# Patient Record
Sex: Female | Born: 1980 | Race: White | Hispanic: Yes | Marital: Married | Smoking: Never smoker
Health system: Southern US, Community
[De-identification: ages and names within clinical notes are randomized; demographics above are authoritative.]

## PROBLEM LIST (undated history)

## (undated) ENCOUNTER — Inpatient Hospital Stay: Payer: Self-pay

## (undated) DIAGNOSIS — D649 Anemia, unspecified: Secondary | ICD-10-CM

## (undated) DIAGNOSIS — G43909 Migraine, unspecified, not intractable, without status migrainosus: Secondary | ICD-10-CM

## (undated) HISTORY — PX: GALLBLADDER SURGERY: SHX652

## (undated) HISTORY — PX: CHOLECYSTECTOMY: SHX55

---

## 2005-08-15 ENCOUNTER — Emergency Department: Payer: Self-pay | Admitting: General Practice

## 2005-08-19 ENCOUNTER — Emergency Department: Payer: Self-pay | Admitting: Emergency Medicine

## 2005-08-21 ENCOUNTER — Ambulatory Visit: Payer: Self-pay | Admitting: Obstetrics and Gynecology

## 2005-10-27 ENCOUNTER — Inpatient Hospital Stay (HOSPITAL_COMMUNITY): Admission: AD | Admit: 2005-10-27 | Discharge: 2005-10-27 | Payer: Self-pay | Admitting: Obstetrics & Gynecology

## 2005-10-30 ENCOUNTER — Inpatient Hospital Stay (HOSPITAL_COMMUNITY): Admission: AD | Admit: 2005-10-30 | Discharge: 2005-10-30 | Payer: Self-pay | Admitting: Family Medicine

## 2006-06-16 ENCOUNTER — Observation Stay: Payer: Self-pay | Admitting: Obstetrics and Gynecology

## 2006-06-24 ENCOUNTER — Inpatient Hospital Stay: Payer: Self-pay | Admitting: Obstetrics and Gynecology

## 2007-09-02 ENCOUNTER — Ambulatory Visit: Payer: Self-pay | Admitting: Family Medicine

## 2007-11-12 ENCOUNTER — Inpatient Hospital Stay: Payer: Self-pay

## 2009-02-19 ENCOUNTER — Emergency Department: Payer: Self-pay | Admitting: Emergency Medicine

## 2009-03-03 ENCOUNTER — Emergency Department: Payer: Self-pay | Admitting: Emergency Medicine

## 2009-03-09 ENCOUNTER — Ambulatory Visit: Payer: Self-pay | Admitting: General Surgery

## 2009-03-16 ENCOUNTER — Ambulatory Visit: Payer: Self-pay | Admitting: General Surgery

## 2009-10-24 ENCOUNTER — Ambulatory Visit: Payer: Self-pay

## 2011-03-26 ENCOUNTER — Emergency Department: Payer: Self-pay | Admitting: Emergency Medicine

## 2011-06-29 ENCOUNTER — Emergency Department: Payer: Self-pay | Admitting: *Deleted

## 2011-07-22 ENCOUNTER — Ambulatory Visit: Payer: Self-pay | Admitting: Gynecologic Oncology

## 2011-07-22 ENCOUNTER — Ambulatory Visit: Payer: Self-pay

## 2011-08-12 ENCOUNTER — Ambulatory Visit: Payer: Self-pay | Admitting: Gynecologic Oncology

## 2011-08-25 LAB — PATHOLOGY REPORT

## 2011-09-05 ENCOUNTER — Ambulatory Visit: Payer: Self-pay | Admitting: Gynecologic Oncology

## 2011-10-02 ENCOUNTER — Ambulatory Visit: Payer: Self-pay | Admitting: Gynecologic Oncology

## 2011-10-02 LAB — BASIC METABOLIC PANEL
Anion Gap: 9 (ref 7–16)
BUN: 10 mg/dL (ref 7–18)
Calcium, Total: 9.2 mg/dL (ref 8.5–10.1)
Chloride: 101 mmol/L (ref 98–107)
Co2: 28 mmol/L (ref 21–32)
Creatinine: 0.59 mg/dL — ABNORMAL LOW (ref 0.60–1.30)
EGFR (African American): >60
EGFR (Non-African Amer.): >60
Glucose: 101 mg/dL — ABNORMAL HIGH (ref 65–99)
Osmolality: 275 (ref 275–301)
Potassium: 3.5 mmol/L (ref 3.5–5.1)
Sodium: 138 mmol/L (ref 136–145)

## 2011-10-02 LAB — CBC
HCT: 42.3 % (ref 35.0–47.0)
HGB: 14.3 g/dL (ref 12.0–16.0)
MCH: 31.2 pg (ref 26.0–34.0)
Platelet: 252 10*3/uL (ref 150–440)
WBC: 7 10*3/uL (ref 3.6–11.0)

## 2011-10-06 ENCOUNTER — Ambulatory Visit: Payer: Self-pay | Admitting: Gynecologic Oncology

## 2011-10-07 ENCOUNTER — Ambulatory Visit: Payer: Self-pay | Admitting: Gynecologic Oncology

## 2011-11-05 ENCOUNTER — Ambulatory Visit: Payer: Self-pay

## 2011-11-05 ENCOUNTER — Ambulatory Visit: Payer: Self-pay | Admitting: Gynecologic Oncology

## 2011-12-16 ENCOUNTER — Ambulatory Visit: Payer: Self-pay | Admitting: Gynecologic Oncology

## 2012-01-05 ENCOUNTER — Ambulatory Visit: Payer: Self-pay | Admitting: Gynecologic Oncology

## 2012-06-08 ENCOUNTER — Ambulatory Visit: Payer: Self-pay

## 2012-10-23 ENCOUNTER — Emergency Department: Payer: Self-pay | Admitting: Emergency Medicine

## 2013-11-18 ENCOUNTER — Emergency Department: Payer: Self-pay | Admitting: Emergency Medicine

## 2013-11-18 LAB — COMPREHENSIVE METABOLIC PANEL
ALT: 91 U/L — AB (ref 12–78)
ANION GAP: 6 — AB (ref 7–16)
Albumin: 3.8 g/dL (ref 3.4–5.0)
Alkaline Phosphatase: 82 U/L
BUN: 8 mg/dL (ref 7–18)
Bilirubin,Total: 0.4 mg/dL (ref 0.2–1.0)
Calcium, Total: 9.4 mg/dL (ref 8.5–10.1)
Chloride: 102 mmol/L (ref 98–107)
Co2: 27 mmol/L (ref 21–32)
Creatinine: 0.59 mg/dL — ABNORMAL LOW (ref 0.60–1.30)
EGFR (Non-African Amer.): >60
Glucose: 125 mg/dL — ABNORMAL HIGH (ref 65–99)
OSMOLALITY: 270 (ref 275–301)
Potassium: 3.6 mmol/L (ref 3.5–5.1)
SGOT(AST): 56 U/L — ABNORMAL HIGH (ref 15–37)
Sodium: 135 mmol/L — ABNORMAL LOW (ref 136–145)
Total Protein: 8.4 g/dL — ABNORMAL HIGH (ref 6.4–8.2)

## 2013-11-18 LAB — CBC
HCT: 43.5 % (ref 35.0–47.0)
HGB: 14.9 g/dL (ref 12.0–16.0)
MCH: 31 pg (ref 26.0–34.0)
MCHC: 34.2 g/dL (ref 32.0–36.0)
MCV: 91 fL (ref 80–100)
Platelet: 280 10*3/uL (ref 150–440)
RBC: 4.81 10*6/uL (ref 3.80–5.20)
RDW: 12.8 % (ref 11.5–14.5)
WBC: 12.2 10*3/uL — AB (ref 3.6–11.0)

## 2013-11-18 LAB — URINALYSIS, COMPLETE
BILIRUBIN, UR: NEGATIVE
Glucose,UR: NEGATIVE mg/dL (ref 0–75)
Ketone: NEGATIVE
Leukocyte Esterase: NEGATIVE
Nitrite: NEGATIVE
Ph: 5 (ref 4.5–8.0)
Specific Gravity: 1.029 (ref 1.003–1.030)
Squamous Epithelial: NONE SEEN

## 2013-11-18 LAB — LIPASE, BLOOD: Lipase: 165 U/L (ref 73–393)

## 2013-11-19 LAB — URINE CULTURE

## 2014-10-29 NOTE — Op Note (Signed)
PATIENT NAME:  Laura Mcguire, Laynie L MR#:  [id number] DATE OF BIRTH:  January 28, 1981  DATE OF PROCEDURE:  10/07/2011  PREOPERATIVE DIAGNOSIS: Severe dysplasia of the cervix.   POSTOPERATIVE DIAGNOSIS: Severe dysplasia of the cervix.   PROCEDURES PERFORMED: Examination under anesthesia and LEEP.  SURGEON: Maxine GlennBrigitte E. Cyniah Gossard, M.D.   ANESTHESIA: General.   ESTIMATED BLOOD LOSS: 50 mL.   COMPLICATIONS: None.   INDICATION FOR SURGERY: Ms. Iran OuchFrausto is a 34 year old patient who presented with an abnormal Pap smear and on cervical biopsy was noted to have severe dysplasia; therefore, the decision was made to proceed with a LEEP of the entire transformation zone.   FINDINGS AT TIME OF SURGERY: Normal external genitalia, urethral meatus, urethra, bladder, and vagina. Cervix with a central iodine-negative area, no enlargement, no masses. IUD thread is in situ,  Parametria soft. Uterus of normal form and size. No adnexal masses.   DESCRIPTION OF PROCEDURE: After adequate general anesthesia had been obtained, the patient was prepped and draped in lithotomy position. The cervix was visualized. Lugol stain was obtained. Then the transformation zone was excised using the large loop in three portions so that the IUD thread could be protected. Hemostasis was achieved with cautery. Monsel solution was applied.         The patient tolerated the procedure well and was taken to the recovery room in stable condition. Pad, sponge, needle, and instrument counts were correct x2. ____________________________ Maxine GlennBrigitte E. Devyne Hauger, MD bem:slb D: 10/07/2011 15:09:18 ET T: 10/07/2011 15:48:36 ET JOB#: [id number]  cc: Maxine GlennBrigitte E. Cherina Dhillon, MD, <Dictator> Maxine GlennBRIGITTE E Tanette Chauca MD ELECTRONICALLY SIGNED 10/14/2011 14:53

## 2015-10-09 ENCOUNTER — Encounter: Payer: Self-pay | Admitting: Radiology

## 2015-10-09 ENCOUNTER — Observation Stay
Admission: EM | Admit: 2015-10-09 | Discharge: 2015-10-10 | Disposition: A | Discharge status: Home / Self Care | Payer: BLUE CROSS/BLUE SHIELD | Attending: Internal Medicine | Admitting: Internal Medicine

## 2015-10-09 ENCOUNTER — Emergency Department: Payer: BLUE CROSS/BLUE SHIELD

## 2015-10-09 DIAGNOSIS — G8191 Hemiplegia, unspecified affecting right dominant side: Secondary | ICD-10-CM | POA: Diagnosis not present

## 2015-10-09 DIAGNOSIS — R209 Unspecified disturbances of skin sensation: Secondary | ICD-10-CM | POA: Insufficient documentation

## 2015-10-09 DIAGNOSIS — Z833 Family history of diabetes mellitus: Secondary | ICD-10-CM | POA: Diagnosis not present

## 2015-10-09 DIAGNOSIS — R531 Weakness: Secondary | ICD-10-CM | POA: Diagnosis not present

## 2015-10-09 DIAGNOSIS — Z880 Allergy status to penicillin: Secondary | ICD-10-CM | POA: Insufficient documentation

## 2015-10-09 DIAGNOSIS — R29898 Other symptoms and signs involving the musculoskeletal system: Secondary | ICD-10-CM

## 2015-10-09 DIAGNOSIS — Z823 Family history of stroke: Secondary | ICD-10-CM | POA: Insufficient documentation

## 2015-10-09 DIAGNOSIS — F17200 Nicotine dependence, unspecified, uncomplicated: Secondary | ICD-10-CM | POA: Diagnosis not present

## 2015-10-09 DIAGNOSIS — G43809 Other migraine, not intractable, without status migrainosus: Secondary | ICD-10-CM | POA: Diagnosis not present

## 2015-10-09 DIAGNOSIS — R29810 Facial weakness: Secondary | ICD-10-CM | POA: Insufficient documentation

## 2015-10-09 DIAGNOSIS — R519 Headache, unspecified: Secondary | ICD-10-CM | POA: Diagnosis present

## 2015-10-09 DIAGNOSIS — Z9889 Other specified postprocedural states: Secondary | ICD-10-CM | POA: Diagnosis not present

## 2015-10-09 DIAGNOSIS — R51 Headache: Secondary | ICD-10-CM

## 2015-10-09 HISTORY — DX: Migraine, unspecified, not intractable, without status migrainosus: G43.909

## 2015-10-09 LAB — COMPREHENSIVE METABOLIC PANEL
ALBUMIN: 4.7 g/dL (ref 3.5–5.0)
ALK PHOS: 73 U/L (ref 38–126)
ALT: 17 U/L (ref 14–54)
AST: 19 U/L (ref 15–41)
Anion gap: 8 (ref 5–15)
BILIRUBIN TOTAL: 0.3 mg/dL (ref 0.3–1.2)
BUN: 10 mg/dL (ref 6–20)
CALCIUM: 10 mg/dL (ref 8.9–10.3)
CO2: 26 mmol/L (ref 22–32)
CREATININE: 0.68 mg/dL (ref 0.44–1.00)
Chloride: 103 mmol/L (ref 101–111)
GFR calc Af Amer: >60 mL/min (ref >60)
GFR calc non Af Amer: >60 mL/min (ref >60)
GLUCOSE: 118 mg/dL — AB (ref 65–99)
POTASSIUM: 3.2 mmol/L — AB (ref 3.5–5.1)
Sodium: 137 mmol/L (ref 135–145)
TOTAL PROTEIN: 8.8 g/dL — AB (ref 6.5–8.1)

## 2015-10-09 LAB — URINALYSIS COMPLETE WITH MICROSCOPIC (ARMC ONLY)
Bacteria, UA: NONE SEEN
Bilirubin Urine: NEGATIVE
Glucose, UA: NEGATIVE mg/dL
KETONES UR: NEGATIVE mg/dL
Leukocytes, UA: NEGATIVE
Nitrite: NEGATIVE
PROTEIN: NEGATIVE mg/dL
Specific Gravity, Urine: 1.015 (ref 1.005–1.030)
pH: 6 (ref 5.0–8.0)

## 2015-10-09 LAB — URINE DRUG SCREEN, QUALITATIVE (ARMC ONLY)
Amphetamines, Ur Screen: NOT DETECTED
Barbiturates, Ur Screen: NOT DETECTED
Benzodiazepine, Ur Scrn: NOT DETECTED
CANNABINOID 50 NG, UR ~~LOC~~: NOT DETECTED
Cocaine Metabolite,Ur ~~LOC~~: NOT DETECTED
MDMA (ECSTASY) UR SCREEN: NOT DETECTED
Methadone Scn, Ur: NOT DETECTED
Opiate, Ur Screen: NOT DETECTED
PHENCYCLIDINE (PCP) UR S: NOT DETECTED
Tricyclic, Ur Screen: NOT DETECTED

## 2015-10-09 LAB — CBC
HCT: 41.8 % (ref 35.0–47.0)
HEMOGLOBIN: 14.7 g/dL (ref 12.0–16.0)
MCH: 30.6 pg (ref 26.0–34.0)
MCHC: 35.1 g/dL (ref 32.0–36.0)
MCV: 87.2 fL (ref 80.0–100.0)
Platelets: 288 10*3/uL (ref 150–440)
RBC: 4.79 MIL/uL (ref 3.80–5.20)
RDW: 13.1 % (ref 11.5–14.5)
WBC: 9.6 10*3/uL (ref 3.6–11.0)

## 2015-10-09 LAB — PROTIME-INR
INR: 0.9
Prothrombin Time: 12.4 seconds (ref 11.4–15.0)

## 2015-10-09 LAB — DIFFERENTIAL
BASOS ABS: 0 10*3/uL (ref 0–0.1)
Basophils Relative: 0 %
Eosinophils Absolute: 0.1 10*3/uL (ref 0–0.7)
Eosinophils Relative: 1 %
LYMPHS ABS: 2.8 10*3/uL (ref 1.0–3.6)
LYMPHS PCT: 29 %
Monocytes Absolute: 0.7 10*3/uL (ref 0.2–0.9)
Monocytes Relative: 8 %
NEUTROS ABS: 6 10*3/uL (ref 1.4–6.5)
NEUTROS PCT: 62 %

## 2015-10-09 LAB — POCT PREGNANCY, URINE: PREG TEST UR: NEGATIVE

## 2015-10-09 LAB — APTT: APTT: 29 s (ref 24–36)

## 2015-10-09 LAB — ETHANOL: Alcohol, Ethyl (B): <5 mg/dL (ref <5)

## 2015-10-09 MED ORDER — ASPIRIN 81 MG PO CHEW
324.0000 mg | CHEWABLE_TABLET | Freq: Once | ORAL | Status: AC
  Administered 2015-10-09: 324 mg via ORAL
  Filled 2015-10-09: qty 4

## 2015-10-09 NOTE — ED Notes (Signed)
Pt c/o headache at this time. No changes noted since initial assessment

## 2015-10-09 NOTE — ED Provider Notes (Signed)
[facility] Emergency Department Provider Note  ____________________________________________  Time seen: On arrival to treatment room, approximately 8:35 PM  I have reviewed the triage vital signs and the nursing notes.   HISTORY  Chief Complaint Facial Droop  interviewed and examined with Spanish interpreter at bedside   HPI Laura Mcguire is a 35 y.o. female who reports sudden onset of right-sided facial droop and right arm weakness about 30 minutes ago. Never had anything like this before. No recent trauma. His been in her usual state of health without any other symptoms recently. No vision changes. Has had intermittent headaches in the occipital area for which she has been taking Tylenol.  No chest pain or shortness of breath. No abdominal pain nausea vomiting or diarrhea. Last menstrual period was about one week ago, normal timing.     History reviewed. No pertinent past medical history.   There are no active problems to display for this patient.    Past Surgical History  Procedure Laterality Date  . Gallbladder surgery       No current outpatient prescriptions on file. None  Allergies Penicillins   No family history on file.  Social History Social History  Substance Use Topics  . Smoking status: Current Every Day Smoker  . Smokeless tobacco: None  . Alcohol Use: Yes     Comment: Socially     Review of Systems  Constitutional:   No fever or chills. No weight changes Eyes:   No vision changes.  ENT:   No sore throat. No rhinorrhea. Cardiovascular:   No chest pain. Respiratory:   No dyspnea or cough. Gastrointestinal:   Negative for abdominal pain, vomiting and diarrhea.  No BRBPR or melena. Genitourinary:   Negative for dysuria or difficulty urinating. Musculoskeletal:   Negative for focal pain or swelling Skin:   Negative for rash. Neurological:   Positive headache with right facial and right upper extremity  weakness.  10-point ROS otherwise negative.  ____________________________________________   PHYSICAL EXAM:  VITAL SIGNS: ED Triage Vitals  Enc Vitals Group     BP 10/09/15 2040 169/77 mmHg     Pulse Rate 10/09/15 2040 93     Resp 10/09/15 2040 15     Temp 10/09/15 2125 98.1 F (36.7 C)     Temp src --      SpO2 10/09/15 2040 95 %     Weight 10/09/15 2040 170 lb 4.8 oz (77.248 kg)     Height 10/09/15 2040  (1.651 m)     Head Cir --      Peak Flow --      Pain Score 10/09/15 2044 0     Pain Loc --      Pain Edu? --      Excl. in GC? --     Vital signs reviewed, nursing assessments reviewed.   Constitutional:   Alert and oriented. Anxious and upset about her current symptoms. Eyes:   No scleral icterus. No conjunctival pallor. PERRL. EOMI ENT   Head:   Normocephalic and atraumatic.   Nose:   No congestion/rhinnorhea. No septal hematoma   Mouth/Throat:   MMM, no pharyngeal erythema. No peritonsillar mass.    Neck:   No stridor. No SubQ emphysema. No meningismus. Hematological/Lymphatic/Immunilogical:   No cervical lymphadenopathy. Cardiovascular:   RRR. Symmetric bilateral radial and DP pulses.  No murmurs.  Respiratory:   Normal respiratory effort without tachypnea nor retractions. Breath sounds are clear and equal bilaterally.  No wheezes/rales/rhonchi. Gastrointestinal:   Soft and nontender. Non distended. There is no CVA tenderness.  No rebound, rigidity, or guarding. Genitourinary:   deferred Musculoskeletal:   Nontender with normal range of motion in all extremities. No joint effusions.  No lower extremity tenderness.  No edema. Neurologic:   Slightly slurred speech Cranial nerve exam significant for facial asymmetry with smile. Her mouth performs morbid twisting motion rather than exhibiting a unilateral paresis. Forehead is spared and symmetric. Tongue protrusion deviates to the right. Motor exam reveals diminished strength in the right upper  extremity to grip and bicep and tricep stress. However, there is no pronator drift, and finger to nose is symmetric. Lower extremity exam is symmetric and normal. NIH stroke scale equals 4  Skin:    Skin is warm, dry and intact. No rash noted.  No petechiae, purpura, or bullae. ____________________________________________    LABS (pertinent positives/negatives) (all labs ordered are listed, but only abnormal results are displayed) Labs Reviewed  COMPREHENSIVE METABOLIC PANEL - Abnormal; Notable for the following:    Potassium 3.2 (*)    Glucose, Bld 118 (*)    Total Protein 8.8 (*)    All other components within normal limits  PROTIME-INR  APTT  CBC  DIFFERENTIAL  ETHANOL  URINE DRUG SCREEN, QUALITATIVE ([facility] ONLY)  URINALYSIS COMPLETEWITH MICROSCOPIC ([facility] ONLY)  POC URINE PREG, ED  POCT PREGNANCY, URINE  CBG MONITORING, ED   ____________________________________________   EKG  Interpreted by me Normal sinus rhythm rate of 93, normal axis and intervals. Normal QRS ST segments and T waves.  ____________________________________________    RADIOLOGY  CT head unremarkable  ____________________________________________   PROCEDURES CRITICAL CARE Performed by: Scotty Court, Maryn Freelove   Total critical care time: 35 minutes  Critical care time was exclusive of separately billable procedures and treating other patients.  Critical care was necessary to treat or prevent imminent or life-threatening deterioration.  Critical care was time spent personally by me on the following activities: development of treatment plan with patient and/or surrogate as well as nursing, discussions with consultants, evaluation of patient's response to treatment, examination of patient, obtaining history from patient or surrogate, ordering and performing treatments and interventions, ordering and review of laboratory studies, ordering and review of radiographic studies, pulse oximetry and  re-evaluation of patient's condition.   ____________________________________________   INITIAL IMPRESSION / ASSESSMENT AND PLAN / ED COURSE  Pertinent labs & imaging results that were available during my care of the patient were reviewed by me and considered in my medical decision making (see chart for details).  Patient presents with acute neurologic symptoms. Patient immediate was started on code stroke. Her neurologic exam is somewhat inconsistent, but due to the acute onset and pronounced features with the deficits, TPA is a consideration, and consultation with the neurologist. CT unremarkable  case discussed with the neurologist to actually feels that this is unlikely to be a stroke. At this age he has a higher suspicion of multiple sclerosis or complex migraine.  Patient given chewable aspirin. Lab workup is unremarkable, pregnancy negative. Results and plan discussed with the patient who is in agreement. Case discussed with the hospitalist Dr. Anne Hahn to evaluate for admission in the emergency department. In consultation with neurology, it is not in the patient's best interest to administer TPA, no medications warranted other than aspirin. We'll deferr to inpatient team for further workup and management.     ____________________________________________   FINAL CLINICAL IMPRESSION(S) / ED DIAGNOSES  Final diagnoses:  None  acute right facial weakness Recurrent headaches    Sharman CheekPhillip Joane Postel, MD 10/11/15 [phone]

## 2015-10-09 NOTE — ED Notes (Signed)
Pt reports she has been having frequent headaches in the back of the head

## 2015-10-09 NOTE — Progress Notes (Signed)
   10/09/15 2030  Clinical Encounter Type  Visited With Patient and family together  Visit Type ED  Referral From Nurse  Consult/Referral To Chaplain  Spiritual Encounters  Spiritual Needs Prayer  Stress Factors  Patient Stress Factors Health changes  Provided pastoral care & prayer. Chap. Gerard Cantara G. Shelvy Perazzo, ext. [phone]

## 2015-10-09 NOTE — ED Notes (Signed)
Per patient, about 20 minutes ago she noticed a right sided facial droop and right arm weakness. Patient is spanish speaking. MD at bedside assessing patient. Interpreter paged. Patient brought back immediatly to room 15.

## 2015-10-10 ENCOUNTER — Observation Stay: Payer: BLUE CROSS/BLUE SHIELD

## 2015-10-10 ENCOUNTER — Encounter: Payer: Self-pay | Admitting: Internal Medicine

## 2015-10-10 DIAGNOSIS — G8191 Hemiplegia, unspecified affecting right dominant side: Secondary | ICD-10-CM | POA: Diagnosis not present

## 2015-10-10 DIAGNOSIS — G43909 Migraine, unspecified, not intractable, without status migrainosus: Secondary | ICD-10-CM

## 2015-10-10 LAB — LIPID PANEL
CHOL/HDL RATIO: 4.1 ratio
CHOLESTEROL: 183 mg/dL (ref 0–200)
HDL: 45 mg/dL (ref >40)
LDL CALC: 95 mg/dL (ref 0–99)
Triglycerides: 216 mg/dL — ABNORMAL HIGH (ref <150)
VLDL: 43 mg/dL — AB (ref 0–40)

## 2015-10-10 LAB — CBC
HCT: 38.1 % (ref 35.0–47.0)
Hemoglobin: 13.1 g/dL (ref 12.0–16.0)
MCH: 30.6 pg (ref 26.0–34.0)
MCHC: 34.5 g/dL (ref 32.0–36.0)
MCV: 88.8 fL (ref 80.0–100.0)
PLATELETS: 240 10*3/uL (ref 150–440)
RBC: 4.29 MIL/uL (ref 3.80–5.20)
RDW: 12.6 % (ref 11.5–14.5)
WBC: 8 10*3/uL (ref 3.6–11.0)

## 2015-10-10 LAB — CREATININE, SERUM
CREATININE: 0.68 mg/dL (ref 0.44–1.00)
GFR calc Af Amer: >60 mL/min (ref >60)

## 2015-10-10 MED ORDER — AMITRIPTYLINE HCL 25 MG PO TABS
25.0000 mg | ORAL_TABLET | Freq: Every day | ORAL | Status: DC
Start: 2015-10-10 — End: 2016-06-26

## 2015-10-10 MED ORDER — STROKE: EARLY STAGES OF RECOVERY BOOK
Freq: Once | Status: AC
  Administered 2015-10-10: 02:00:00

## 2015-10-10 MED ORDER — KETOROLAC TROMETHAMINE 30 MG/ML IJ SOLN
30.0000 mg | Freq: Once | INTRAMUSCULAR | Status: AC
  Administered 2015-10-10: 30 mg via INTRAVENOUS
  Filled 2015-10-10: qty 1

## 2015-10-10 MED ORDER — DIPHENHYDRAMINE HCL 50 MG/ML IJ SOLN
25.0000 mg | Freq: Once | INTRAMUSCULAR | Status: AC
  Administered 2015-10-10: 25 mg via INTRAVENOUS
  Filled 2015-10-10: qty 1

## 2015-10-10 MED ORDER — GADOBENATE DIMEGLUMINE 529 MG/ML IV SOLN
15.0000 mL | Freq: Once | INTRAVENOUS | Status: AC | PRN
  Administered 2015-10-10: 14 mL via INTRAVENOUS

## 2015-10-10 MED ORDER — AMITRIPTYLINE HCL 10 MG PO TABS: 25.0000 mg | ORAL_TABLET | Freq: Every day | ORAL | Status: DC

## 2015-10-10 MED ORDER — ACETAMINOPHEN 325 MG PO TABS
650.0000 mg | ORAL_TABLET | Freq: Four times a day (QID) | ORAL | Status: DC | PRN
  Administered 2015-10-10: 650 mg via ORAL
  Filled 2015-10-10: qty 2

## 2015-10-10 MED ORDER — ASPIRIN 81 MG PO CHEW
81.0000 mg | CHEWABLE_TABLET | Freq: Every day | ORAL | Status: DC
Start: 2015-10-10 — End: 2015-10-10
  Administered 2015-10-10: 14:00:00 81 mg via ORAL
  Filled 2015-10-10: qty 1

## 2015-10-10 MED ORDER — ENOXAPARIN SODIUM 40 MG/0.4ML ~~LOC~~ SOLN
40.0000 mg | SUBCUTANEOUS | Status: DC
  Administered 2015-10-10: 03:00:00 40 mg via SUBCUTANEOUS
  Filled 2015-10-10: qty 0.4

## 2015-10-10 NOTE — Progress Notes (Signed)
Physical Therapy Evaluation Patient Details Name: Laura Mcguire MRN: [id number] DOB: 02/24/1981 Today's Date: 10/10/2015   History of Present Illness  Laura Mcguire is a 35 y.o. female who presents with Acute onset of right hemiparesis. Patient states that around 5:00 the afternoon of presentation the ED she developed acute onset right facial droop, right upper extremity weakness, and right lower extremity sensory change. She denies any past medical history except for some sporadic migraines and does not take any medications on a regular basis. She has never had neurological symptoms with her migraines in the past. Her symptoms are persistent in the ED, and hospitalists were called for admission for workup.  So far CT and MRI of head negative for stroke.  Waiting for neuro consult for further work-up.  Clinical Impression  Pt is independent with all mobility this date including bed mobility, transfers, gait, and stairs.  Pt able to complete high level balance activities including single leg stance bilaterally.  Pt has decreased strength on R side and decreased sensation but it is not affecting her basic mobility needs at this time.  Pt demonstrated good safety awareness with all activity.  No further PT services indicated at this time.    Follow Up Recommendations No PT follow up    Equipment Recommendations  None recommended by PT    Recommendations for Other Services       Precautions / Restrictions Precautions Precautions: None Restrictions Weight Bearing Restrictions: No      Mobility  Bed Mobility Overal bed mobility: Independent                Transfers Overall transfer level: Independent               General transfer comment: Good body mechanics and safety awareness  Ambulation/Gait Ambulation/Gait assistance: Modified independent (Device/Increase time) Ambulation Distance (Feet): 250 Feet Assistive device: None Gait Pattern/deviations:  Step-through pattern     General Gait Details: Equal step lengths bilaterally with no ataxia or balance deviations; able to increase/decrease speed, make quick 180 degree turns and dual task with turning head while walking.  Gait slightly hesitant  Stairs Stairs: Yes Stairs assistance: Independent Stair Management: One rail Right Number of Stairs: 5 General stair comments: Reciprocal gait pattern using one rail, no deviations and no safety concerns  Wheelchair Mobility    Modified Rankin (Stroke Patients Only)       Balance Overall balance assessment: Independent                               Standardized Balance Assessment Standardized Balance Assessment : Berg Balance Test Berg Balance Test Sit to Stand: Able to stand without using hands and stabilize independently Standing Unsupported: Able to stand safely 2 minutes Stand to Sit: Sits safely with minimal use of hands Transfers: Able to transfer safely, minor use of hands Standing Unsupported with Eyes Closed: Able to stand 10 seconds safely Standing Ubsupported with Feet Together: Able to place feet together independently and stand 1 minute safely Standing Unsupported, One Foot in Front: Able to place foot tandem independently and hold 30 seconds Standing on One Leg: Able to lift leg independently and hold > 10 seconds         Pertinent Vitals/Pain Pain Assessment: No/denies pain    Home Living Family/patient expects to be discharged to:: Private residence Living Arrangements: Children (407 and 34 year old) Available Help at Discharge: Family  Type of Home: House Home Access: Level entry     Home Layout: One level        Prior Function Level of Independence: Independent         Comments: 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), mostly uses R arm for job     Higher education careers adviser        Extremity/Trunk Assessment   Upper Extremity Assessment: RUE deficits/detail RUE  Deficits / Details: 4/5 strength   RUE Sensation: decreased light touch     Lower Extremity Assessment: RLE deficits/detail RLE Deficits / Details: 4/5 strength       Communication   Communication: Prefers language other than English;Interpreter utilized  Cognition Arousal/Alertness: Awake/alert Behavior During Therapy: WFL for tasks assessed/performed Overall Cognitive Status: Within Functional Limits for tasks assessed                      General Comments      Exercises        Assessment/Plan    PT Assessment Patent does not need any further PT services  PT Diagnosis Hemiplegia dominant side   PT Problem List    PT Treatment Interventions     PT Goals (Current goals can be found in the Care Plan section) Acute Rehab PT Goals Patient Stated Goal: None stated    Frequency     Barriers to discharge        Co-evaluation               End of Session Equipment Utilized During Treatment: Gait belt Activity Tolerance: Patient tolerated treatment well Patient left: in bed;with family/visitor present Nurse Communication: Mobility status    Functional Assessment Tool Used: clinical judgement Functional Limitation: Mobility: Walking and moving around Mobility: Walking and Moving Around Current Status [phone]): 0 percent impaired, limited or restricted Mobility: Walking and Moving Around Goal Status [phone]): 0 percent impaired, limited or restricted Mobility: Walking and Moving Around Discharge Status [phone]): 0 percent impaired, limited or restricted    Time: 0865-7846 PT Time Calculation (min) (ACUTE ONLY): 17 min   Charges:   PT Evaluation $PT Eval Low Complexity: 1 Procedure     PT G Codes:   PT G-Codes **NOT FOR INPATIENT CLASS** Functional Assessment Tool Used: clinical judgement Functional Limitation: Mobility: Walking and moving around Mobility: Walking and Moving Around Current Status (N6295): 0 percent impaired, limited or  restricted Mobility: Walking and Moving Around Goal Status ([id number]): 0 percent impaired, limited or restricted Mobility: Walking and Moving Around Discharge Status (K4401): 0 percent impaired, limited or restricted    Modell Fendrick A Suhailah Kwan, PT 10/10/2015, 1:24 PM

## 2015-10-10 NOTE — Plan of Care (Signed)
Problem: Education: Goal: Knowledge of [facility] Education information/materials will improve Outcome: Progressing Pt does speak limited English [facility]    Past Medical History   Diagnosis  Date   .  Migraines

## 2015-10-10 NOTE — Progress Notes (Signed)
Speech Therapy Note: received order, reviewed chart notes. Met w/ pt w/ Interpreter present prior to pt leaving room for tests. Pt denied any trouble swallowing but endorsed that it was a "little hard" to eat her bagel at breakfast meal this AM. Pt denied overt s/s of aspiration drinking liquids; NSG reported good toleration of swallowing pills w/ water w/ them. Pt spoke easily and clearly in JAARS w/ Interpreter w/ no difficulty in articulation or intelligibility per Interpreter as they conversed. Pt endorsed min. numbness, heaviness in the R labial area w/ slight-min. decreased tone noted but overall adequate symmetry during speech. ST will f/u w/ pt tomorrow for any further recommendations for f/u at discharge for decreased labial tone/strength. Will also provide labial exs. Pt agreed. NSG updated.

## 2015-10-10 NOTE — H&P (Signed)
[facility] [place] [facility]   PATIENT NAME: Laura Mcguire    MR#:  [id number]  DATE OF BIRTH:  06/16/1981  DATE OF ADMISSION:  10/09/2015  PRIMARY CARE PHYSICIAN: No primary care provider on file.   REQUESTING/REFERRING PHYSICIAN: Scotty CourtStafford, M.D.  CHIEF COMPLAINT:   Chief Complaint  Patient presents with  . Facial Droop    HISTORY OF PRESENT ILLNESS:  Floria RavelingSaydi Bernerd PhoFrausto Mcguire  is a 35 y.o. female who presents with Acute onset of right hemiparesis. Patient states that around 5:00 the afternoon of presentation the ED she developed acute onset right facial droop, right upper extremity weakness, and right lower extremity sensory change. She denies any past medical history except for some sporadic migraines and does not take any medications on a regular basis. She has never had neurological symptoms with her migraines in the past. Her symptoms are persistent in the ED, and hospitalists were called for admission for workup.  PAST MEDICAL HISTORY:   Past Medical History  Diagnosis Date  . Migraines     PAST SURGICAL HISTORY:   Past Surgical History  Procedure Laterality Date  . Gallbladder surgery      SOCIAL HISTORY:   Social History  Substance Use Topics  . Smoking status: Current Every Day Smoker  . Smokeless tobacco: Not on file  . Alcohol Use: 0.0 oz/week    0 Standard drinks or equivalent per week     Comment: Socially     FAMILY HISTORY:   Family History  Problem Relation Age of Onset  . Diabetes    . Stroke      DRUG ALLERGIES:   Allergies  Allergen Reactions  . Penicillins Hives and Swelling    Has patient had a PCN reaction causing immediate rash, facial/tongue/throat swelling, SOB or lightheadedness with hypotension: yes Has patient had a PCN reaction causing severe rash involving mucus membranes or skin necrosis: {no Has patient had a PCN reaction that required hospitalization no Has patient had a PCN reaction  occurring within the last 10 years: no If all of the above answers are "NO", then may proceed with Cephalosporin use.     MEDICATIONS AT HOME:   Prior to Admission medications   Not on File    REVIEW OF SYSTEMS:  Review of Systems  Constitutional: Negative for fever, chills, weight loss and malaise/fatigue.  HENT: Negative for ear pain, hearing loss and tinnitus.   Eyes: Negative for blurred vision, double vision, pain and redness.  Respiratory: Negative for cough, hemoptysis and shortness of breath.   Cardiovascular: Negative for chest pain, palpitations, orthopnea and leg swelling.  Gastrointestinal: Negative for nausea, vomiting, abdominal pain, diarrhea and constipation.  Genitourinary: Negative for dysuria, frequency and hematuria.  Musculoskeletal: Negative for back pain, joint pain and neck pain.  Skin:       No acne, rash, or lesions  Neurological: Positive for sensory change and focal weakness. Negative for dizziness, tremors and weakness.  Endo/Heme/Allergies: Negative for polydipsia. Does not bruise/bleed easily.  Psychiatric/Behavioral: Negative for depression. The patient is not nervous/anxious and does not have insomnia.      VITAL SIGNS:   Filed Vitals:   10/09/15 2125 10/09/15 2130 10/09/15 2230 10/10/15 0030  BP:  135/85 148/76 125/77  Pulse:  76 66 58  Temp: 98.1 F (36.7 C)     Resp:  30 14 10   Height:      Weight:      SpO2:  97% 99% 99%  Wt Readings from Last 3 Encounters:  10/09/15 77.248 kg (170 lb 4.8 oz)    PHYSICAL EXAMINATION:  Physical Exam  Vitals reviewed. Constitutional: She is oriented to person, place, and time. She appears well-developed and well-nourished. No distress.  HENT:  Head: Normocephalic and atraumatic.  Mouth/Throat: Oropharynx is clear and moist.  Eyes: Conjunctivae and EOM are normal. Pupils are equal, round, and reactive to light. No scleral icterus.  Neck: Normal range of motion. Neck supple. No JVD present. No  thyromegaly present.  Cardiovascular: Normal rate, regular rhythm and intact distal pulses.  Exam reveals no gallop and no friction rub.   No murmur heard. Respiratory: Effort normal and breath sounds normal. No respiratory distress. She has no wheezes. She has no rales.  GI: Soft. Bowel sounds are normal. She exhibits no distension. There is no tenderness.  Musculoskeletal: Normal range of motion. She exhibits no edema.  No arthritis, no gout  Lymphadenopathy:    She has no cervical adenopathy.  Neurological: She is alert and oriented to person, place, and time. No cranial nerve deficit.  Neurologic: Cranial nerves II-XII intact except for right lower facial droop, Sensation intact to light touch/pinprick on the left, but decreased to light touch in the upper and lower extremity on the right, 5/5 strength in left upper and left lower extremities, 3/5 strength right upper extremity, 4/5 strength right lower extremity, mild dysarthria, no aphasia, no dysphagia, memory intact, finger to nose testing showed no abnormality, no pronator drift  Skin: Skin is warm and dry. No rash noted. No erythema.  Psychiatric: She has a normal mood and affect. Her behavior is normal. Judgment and thought content normal.    LABORATORY PANEL:   CBC  Recent Labs Lab 10/09/15 2046  WBC 9.6  HGB 14.7  HCT 41.8  PLT 288   ------------------------------------------------------------------------------------------------------------------  Chemistries   Recent Labs Lab 10/09/15 2046  NA 137  K 3.2*  CL 103  CO2 26  GLUCOSE 118*  BUN 10  CREATININE 0.68  CALCIUM 10.0  AST 19  ALT 17  ALKPHOS 73  BILITOT 0.3   ------------------------------------------------------------------------------------------------------------------  Cardiac Enzymes No results for input(s): TROPONINI in the last 168  hours. ------------------------------------------------------------------------------------------------------------------  RADIOLOGY:  Ct Head Wo Contrast  10/09/2015  CLINICAL DATA:  Facial droop.  Right arm paresthesias. EXAM: CT HEAD WITHOUT CONTRAST TECHNIQUE: Contiguous axial images were obtained from the base of the skull through the vertex without intravenous contrast. COMPARISON:  None. FINDINGS: There is no intracranial hemorrhage, mass or evidence of acute infarction. There is no extra-axial fluid collection. Gray matter and white matter appear normal. Cerebral volume is normal for age. Brainstem and posterior fossa are unremarkable. The CSF spaces appear normal. The bony structures are intact. The visible portions of the paranasal sinuses are clear. IMPRESSION: Normal brain Electronically Signed   By: Ellery Plunk M.D.   On: 10/09/2015 20:37    EKG:   Orders placed or performed during the hospital encounter of 10/09/15  . ED EKG  . ED EKG  . EKG 12-Lead  . 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 Patient has no significant risk factors for stroke, other than family history of stroke in her mother, though she states this occurred at an older age. Patient herself has no diagnosed medical problems other than infrequent migraines, and takes no daily medications. We'll get neurology to see her,  and admitted her for an MRI with contrast on the thought that this could be complex migraine versus stroke versus multiple sclerosis. We will hold off on ordering other stroke workup tests at this time until we see what MRI shows. Active Problems:   Headache - we will treat her headache as a migraine, monitor for improvement.  All the records are reviewed and case discussed with ED provider. Management plans discussed with the patient and/or family.  DVT PROPHYLAXIS: SubQ lovenox  GI  PROPHYLAXIS: None  ADMISSION STATUS: Observation  CODE STATUS: Full Code Status History    This patient does not have a recorded code status. Please follow your organizational policy for patients in this situation.      TOTAL TIME TAKING CARE OF THIS PATIENT: 45 minutes.    Tabbitha Janvrin FIELDING 10/10/2015, 12:56 AM  [facility]  Office  [phone]  CC: Primary care physician; No primary care provider on file.

## 2015-10-10 NOTE — Progress Notes (Signed)
Received MD order to discharge patient to home, reviewed all discharge instructions, prescriptions and follow up appointments with patient thru hospital interpretor and patient verbalized understanding discharged to home with nursing and family member via wheelchair

## 2015-10-10 NOTE — Progress Notes (Signed)
Patient ID: Alver FisherSaydi L Frausto Mcguire, female   DOB: 02/24/1981, 35 y.o.   MRN: [id number] [facility] [place] [facility]        Laura Mcguire was admitted to the Hospital on 10/09/2015 and Discharged  10/10/2015 and should be excused from work/school   for 3 days starting 10/09/2015 , may return to work/school without any restrictions.  Alford HighlandWIETING, Laura Mcguire M.D on 10/10/2015,at 6:51 PM  [facility] [place] [facility]    Office  [phone]

## 2015-10-10 NOTE — Discharge Summary (Signed)
[facility] - [place] at [facility]   PATIENT NAME: Laura Mcguire    MR#:  [id number]  DATE OF BIRTH:  1980/12/13  DATE OF ADMISSION:  10/09/2015 ADMITTING PHYSICIAN: Oralia Manis, MD  DATE OF DISCHARGE: 10/10/2015  PRIMARY CARE PHYSICIAN: No primary care provider on file.    ADMISSION DIAGNOSIS:  RUE weakness [R29.898]  DISCHARGE DIAGNOSIS:  Complex Migraine causing right sided hemiparesis-resolved  SECONDARY DIAGNOSIS:   Past Medical History  Diagnosis Date  . Migraines     HOSPITAL COURSE:  Laura Mcguire is a 35 y.o. female who presents with Acute onset of right hemiparesis. 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 Headache - we will treat her headache as a migraine, monitor for improvement.  Overall stable for d/c to home  CONSULTS OBTAINED:  Treatment Team:  Thana Farr, MD  DRUG ALLERGIES:   Allergies  Allergen Reactions  . Penicillins Hives and Swelling    Has patient had a PCN reaction causing immediate rash, facial/tongue/throat swelling, SOB or lightheadedness with hypotension: yes Has patient had a PCN reaction causing severe rash involving mucus membranes or skin necrosis: {no Has patient had a PCN reaction that required hospitalization no Has patient had a PCN reaction occurring within the last 10 years: no If all of the above answers are "NO", then may proceed with Cephalosporin use.     DISCHARGE MEDICATIONS:   Current Discharge Medication List    START taking these medications   Details  amitriptyline (ELAVIL) 25 MG tablet Take 1 tablet (25  mg total) by mouth at bedtime. Qty: 30 tablet, Refills: 3        If you experience worsening of your admission symptoms, develop shortness of breath, life threatening emergency, suicidal or homicidal thoughts you must seek medical attention immediately by calling 911 or calling your MD immediately  if symptoms less severe.  You Must read complete instructions/literature along with all the possible adverse reactions/side effects for all the Medicines you take and that have been prescribed to you. Take any new Medicines after you have completely understood and accept all the possible adverse reactions/side effects.   Please note  You were cared for by a hospitalist during your hospital stay. If you have any questions about your discharge medications or the care you received while you were in the hospital after you are discharged, you can call the unit and asked to speak with the hospitalist on call if the hospitalist that took care of you is not available. Once you are discharged, your primary care physician will handle any further medical issues. Please note that NO REFILLS for any discharge medications will be authorized once you are discharged, as it is imperative that you return to your primary care physician (or establish a relationship with a primary care physician if you do not have one) for your aftercare needs so that they can reassess your need for medications and monitor your lab values. Today   SUBJECTIVE   Headache this am  VITAL SIGNS:  Blood pressure 110/59, pulse 65, temperature 98.2 F (36.8 C), temperature source Oral, resp. rate 20, height  (  1.626 m), weight 69.536 kg (153 lb 4.8 oz), last menstrual period 10/09/2015, SpO2 97 %.  I/O:   Intake/Output Summary (Last 24 hours) at 10/10/15 1643 Last data filed at 10/10/15 0900  Gross per 24 hour  Intake    120 ml  Output      0 ml  Net    120 ml    PHYSICAL EXAMINATION:  GENERAL:  35 y.o.-year-old patient lying in  the bed with no acute distress.  EYES: Pupils equal, round, reactive to light and accommodation. No scleral icterus. Extraocular muscles intact.  HEENT: Head atraumatic, normocephalic. Oropharynx and nasopharynx clear.  NECK:  Supple, no jugular venous distention. No thyroid enlargement, no tenderness.  LUNGS: Normal breath sounds bilaterally, no wheezing, rales,rhonchi or crepitation. No use of accessory muscles of respiration.  CARDIOVASCULAR: S1, S2 normal. No murmurs, rubs, or gallops.  ABDOMEN: Soft, non-tender, non-distended. Bowel sounds present. No organomegaly or mass.  EXTREMITIES: No pedal edema, cyanosis, or clubbing.  NEUROLOGIC: Cranial nerves II through XII are intact. Muscle strength 5/5 in all extremities. Sensation intact. Gait not checked.  PSYCHIATRIC: The patient is alert and oriented x 3.  SKIN: No obvious rash, lesion, or ulcer.   DATA REVIEW:   CBC   Recent Labs Lab 10/10/15 0542  WBC 8.0  HGB 13.1  HCT 38.1  PLT 240    Chemistries   Recent Labs Lab 10/09/15 2046 10/10/15 0542  NA 137  --   K 3.2*  --   CL 103  --   CO2 26  --   GLUCOSE 118*  --   BUN 10  --   CREATININE 0.68 0.68  CALCIUM 10.0  --   AST 19  --   ALT 17  --   ALKPHOS 73  --   BILITOT 0.3  --     Microbiology Results   No results found for this or any previous visit (from the past 240 hour(s)).  RADIOLOGY:  Ct Head Wo Contrast  10/09/2015  CLINICAL DATA:  Facial droop.  Right arm paresthesias. EXAM: CT HEAD WITHOUT CONTRAST TECHNIQUE: Contiguous axial images were obtained from the base of the skull through the vertex without intravenous contrast. COMPARISON:  None. FINDINGS: There is no intracranial hemorrhage, mass or evidence of acute infarction. There is no extra-axial fluid collection. Gray matter and white matter appear normal. Cerebral volume is normal for age. Brainstem and posterior fossa are unremarkable. The CSF spaces appear normal. The bony structures are intact.  The visible portions of the paranasal sinuses are clear. IMPRESSION: Normal brain Electronically Signed   By: Ellery Plunkaniel R Mitchell M.D.   On: 10/09/2015 20:37   Mr Laqueta JeanBrain W JXWo Contrast  10/10/2015  CLINICAL DATA:  Acute onset of right hemi paresis. Right-sided facial drooping extremity weakness. Right extremity sensory change. EXAM: MRI HEAD WITHOUT AND WITH CONTRAST TECHNIQUE: Multiplanar, multiecho pulse sequences of the brain and surrounding structures were obtained without and with intravenous contrast. CONTRAST:  14 mL MultiHance COMPARISON:  CT head without contrast 10/09/2014 FINDINGS: No acute infarct, hemorrhage, or mass lesion is present. The ventricles are of normal size. No significant extraaxial fluid collection is present. No significant white matter disease is present. Internal auditory canals are within normal limits bilaterally. Flow is present in the major intracranial arteries. The globes orbits are intact. Mild mucosal thickening is present in the maxillary sinuses bilaterally, left greater than right. Mastoid air cells are clear. Skullbase is normal. Midline sagittal images are unremarkable. The postcontrast  images demonstrate no pathologic enhancement. IMPRESSION: 1. Normal MRI appearance of the brain. 2. Minimal sinus disease. Electronically Signed   By: Marin Roberts M.D.   On: 10/10/2015 10:28     Management plans discussed with the patient, family and they are in agreement.  CODE STATUS:     Code Status Orders        Start     Ordered   10/10/15 0207  Full code   Continuous     10/10/15 0206    Code Status History    Date Active Date Inactive Code Status Order ID Comments User Context   This patient has a current code status but no historical code status.      TOTAL TIME TAKING CARE OF THIS PATIENT: 40 minutes.    Yunior Jain M.D on 10/10/2015 at 4:43 PM  Between 7am to 6pm - Pager - [phone] After 6pm go to [email or web address] - password [email or web address]  [place]  [facility]  Office  [phone]  CC: Primary care physician; No primary care provider on file.

## 2015-10-10 NOTE — Consult Note (Signed)
Referring Physician: Allena Katz    Chief Complaint:  Right sided weakness  HPI: Laura Mcguire is an 35 y.o. female with a history of migraines who presented with right sided weakness.  Patient has been having migraines for years.  Pain is occipital.  Has increased in frequency over the past 3-4 months.  Patient now having 3-4 per week.  On yesterday at 5p in association with her headache had the onset of right sided weakness.  Presented to the ED for further evaluation.  Initial NIHSS of 4.    Date last known well: 10/09/2015 Time last known well: Time: 17:00 tPA Given: No: Not felt to be a stroke  Past Medical History  Diagnosis Date  . Migraines     Past Surgical History  Procedure Laterality Date  . Gallbladder surgery      Family History  Problem Relation Age of Onset  . Diabetes    . Stroke     Social History:  reports that she has been smoking.  She does not have any smokeless tobacco history on file. She reports that she drinks alcohol. She reports that she does not use illicit drugs.  Allergies:  Allergies  Allergen Reactions  . Penicillins Hives and Swelling    Has patient had a PCN reaction causing immediate rash, facial/tongue/throat swelling, SOB or lightheadedness with hypotension: yes Has patient had a PCN reaction causing severe rash involving mucus membranes or skin necrosis: {no Has patient had a PCN reaction that required hospitalization no Has patient had a PCN reaction occurring within the last 10 years: no If all of the above answers are "NO", then may proceed with Cephalosporin use.     Medications:  I have reviewed the patient's current medications. Prior to Admission:  No prescriptions prior to admission   Scheduled: . aspirin  81 mg Oral Daily  . enoxaparin (LOVENOX) injection  40 mg Subcutaneous Q24H    ROS: History obtained from the patient  General ROS: negative for - chills, fatigue, fever, night sweats, weight gain or weight  loss Psychological ROS: negative for - behavioral disorder, hallucinations, memory difficulties, mood swings or suicidal ideation Ophthalmic ROS: negative for - blurry vision, double vision, eye pain or loss of vision ENT ROS: negative for - epistaxis, nasal discharge, oral lesions, sore throat, tinnitus or vertigo Allergy and Immunology ROS: negative for - hives or itchy/watery eyes Hematological and Lymphatic ROS: negative for - bleeding problems, bruising or swollen lymph nodes Endocrine ROS: negative for - galactorrhea, hair pattern changes, polydipsia/polyuria or temperature intolerance Respiratory ROS: negative for - cough, hemoptysis, shortness of breath or wheezing Cardiovascular ROS: negative for - chest pain, dyspnea on exertion, edema or irregular heartbeat Gastrointestinal ROS: negative for - abdominal pain, diarrhea, hematemesis, nausea/vomiting or stool incontinence Genito-Urinary ROS: negative for - dysuria, hematuria, incontinence or urinary frequency/urgency Musculoskeletal ROS: negative for - joint swelling or muscular weakness Neurological ROS: as noted in HPI Dermatological ROS: negative for rash and skin lesion changes  Physical Examination: Blood pressure 110/59, pulse 65, temperature 98.2 F (36.8 C), temperature source Oral, resp. rate 20, height  (1.626 m), weight 69.536 kg (153 lb 4.8 oz), last menstrual period 10/09/2015, SpO2 97 %.  HEENT-  Normocephalic, no lesions, without obvious abnormality.  Normal external eye and conjunctiva.  Normal TM's bilaterally.  Normal auditory canals and external ears. Normal external nose, mucus membranes and septum.  Normal pharynx. Cardiovascular- S1, S2 normal, pulses palpable throughout   Lungs- chest clear, no  wheezing, rales, normal symmetric air entry Abdomen- soft, non-tender; bowel sounds normal; no masses,  no organomegaly Extremities- no edema Lymph-no adenopathy palpable Musculoskeletal-no joint tenderness,  deformity or swelling Skin-warm and dry, no hyperpigmentation, vitiligo, or suspicious lesions  Neurological Examination Mental Status: Alert, oriented, thought content appropriate.  Speech fluent without evidence of aphasia.  Able to follow 3 step commands without difficulty. Cranial Nerves: II: Discs flat bilaterally; Visual fields grossly normal, pupils equal, round, reactive to light and accommodation III,IV, VI: ptosis not present, extra-ocular motions intact bilaterally V,VII: patient pulls mouth to the right with speech, facial light touch sensation normal bilaterally VIII: hearing normal bilaterally IX,X: gag reflex present XI: bilateral shoulder shrug XII: midline tongue extension Motor: Right : Upper extremity   5/5    Left:     Upper extremity   5/5  Lower extremity   5/5     Lower extremity   5/5 Tone and bulk:normal tone throughout; no atrophy noted Sensory: Pinprick and light touch decreased in the LUE Deep Tendon Reflexes: 2+ and symmetric throughout Plantars: Right: downgoing   Left: downgoing Cerebellar: Normal finger-to-nose and normal heel-to-shin testing bilaterally Gait: normal gait and station    Laboratory Studies:  Basic Metabolic Panel:  Recent Labs Lab 10/09/15 2046 10/10/15 0542  NA 137  --   K 3.2*  --   CL 103  --   CO2 26  --   GLUCOSE 118*  --   BUN 10  --   CREATININE 0.68 0.68  CALCIUM 10.0  --     Liver Function Tests:  Recent Labs Lab 10/09/15 2046  AST 19  ALT 17  ALKPHOS 73  BILITOT 0.3  PROT 8.8*  ALBUMIN 4.7   No results for input(s): LIPASE, AMYLASE in the last 168 hours. No results for input(s): AMMONIA in the last 168 hours.  CBC:  Recent Labs Lab 10/09/15 2046 10/10/15 0542  WBC 9.6 8.0  NEUTROABS 6.0  --   HGB 14.7 13.1  HCT 41.8 38.1  MCV 87.2 88.8  PLT 288 240    Cardiac Enzymes: No results for input(s): CKTOTAL, CKMB, CKMBINDEX, TROPONINI in the last 168 hours.  BNP: Invalid input(s):  POCBNP  CBG: No results for input(s): GLUCAP in the last 168 hours.  Microbiology: Results for orders placed or performed in visit on 11/18/13  Urine culture     Status: None   Collection Time: 11/18/13  9:21 AM  Result Value Ref Range Status   Micro Text Report   Final       SOURCE: CLEAN CATCH    ORGANISM 1                1000 CFU/ML GRAM POSITIVE COCCI   ANTIBIOTIC                                                        Coagulation Studies:  Recent Labs  10/09/15 2046  LABPROT 12.4  INR 0.90    Urinalysis:  Recent Labs Lab 10/09/15 2133  COLORURINE YELLOW*  LABSPEC 1.015  PHURINE 6.0  GLUCOSEU NEGATIVE  HGBUR 2+*  BILIRUBINUR NEGATIVE  KETONESUR NEGATIVE  PROTEINUR NEGATIVE  NITRITE NEGATIVE  LEUKOCYTESUR NEGATIVE    Lipid Panel:    Component Value Date/Time   CHOL 183 10/10/2015 0542   TRIG  216* 10/10/2015 0542   HDL 45 10/10/2015 0542   CHOLHDL 4.1 10/10/2015 0542   VLDL 43* 10/10/2015 0542   LDLCALC 95 10/10/2015 0542    HgbA1C: No results found for: HGBA1C  Urine Drug Screen:     Component Value Date/Time   LABOPIA NONE DETECTED 10/09/2015 2133   LABBENZ NONE DETECTED 10/09/2015 2133   AMPHETMU NONE DETECTED 10/09/2015 2133   THCU NONE DETECTED 10/09/2015 2133   LABBARB NONE DETECTED 10/09/2015 2133    Alcohol Level:  Recent Labs Lab 10/09/15 2046  ETH <5    Other results: EKG: sinus rhythm at 93 bpm.  Imaging: Ct Head Wo Contrast  10/09/2015  CLINICAL DATA:  Facial droop.  Right arm paresthesias. EXAM: CT HEAD WITHOUT CONTRAST TECHNIQUE: Contiguous axial images were obtained from the base of the skull through the vertex without intravenous contrast. COMPARISON:  None. FINDINGS: There is no intracranial hemorrhage, mass or evidence of acute infarction. There is no extra-axial fluid collection. Gray matter and white matter appear normal. Cerebral volume is normal for age. Brainstem and posterior fossa are unremarkable. The CSF spaces  appear normal. The bony structures are intact. The visible portions of the paranasal sinuses are clear. IMPRESSION: Normal brain Electronically Signed   By: Ellery Plunkaniel R Mitchell M.D.   On: 10/09/2015 20:37   Mr Laqueta JeanBrain W ZOWo Contrast  10/10/2015  CLINICAL DATA:  Acute onset of right hemi paresis. Right-sided facial drooping extremity weakness. Right extremity sensory change. EXAM: MRI HEAD WITHOUT AND WITH CONTRAST TECHNIQUE: Multiplanar, multiecho pulse sequences of the brain and surrounding structures were obtained without and with intravenous contrast. CONTRAST:  14 mL MultiHance COMPARISON:  CT head without contrast 10/09/2014 FINDINGS: No acute infarct, hemorrhage, or mass lesion is present. The ventricles are of normal size. No significant extraaxial fluid collection is present. No significant white matter disease is present. Internal auditory canals are within normal limits bilaterally. Flow is present in the major intracranial arteries. The globes orbits are intact. Mild mucosal thickening is present in the maxillary sinuses bilaterally, left greater than right. Mastoid air cells are clear. Skullbase is normal. Midline sagittal images are unremarkable. The postcontrast images demonstrate no pathologic enhancement. IMPRESSION: 1. Normal MRI appearance of the brain. 2. Minimal sinus disease. Electronically Signed   By: Marin Robertshristopher  Mattern M.D.   On: 10/10/2015 10:28    Assessment: 35 y.o. female presenting with headache and right hemiparesis.  Symptoms have almost completely resolved.  Rates headache now at a 6/10.  MRI of the brain personally reviewed and is normal.  Suspect a complicated migraine.  With increase in frequency and difficulty controlling with OTC medications, prophylaxis is indicated.    Stroke Risk Factors - smoking  Plan: 1. Elavil 25mg  qhs for migraine prophylaxis.  May require further adjustment in dosage which may be performed on an outpatient basis.   2.  Patient to follow up with  neurology as an outpatient.     Thana FarrLeslie Lititia Sen, MD Neurology [phone] 10/10/2015, 3:36 PM

## 2015-10-11 LAB — HEMOGLOBIN A1C: HEMOGLOBIN A1C: 5.6 % (ref 4.0–6.0)

## 2016-03-18 ENCOUNTER — Emergency Department
Admission: EM | Admit: 2016-03-18 | Discharge: 2016-03-18 | Disposition: A | Discharge status: Home / Self Care | Payer: BLUE CROSS/BLUE SHIELD | Attending: Emergency Medicine | Admitting: Emergency Medicine

## 2016-03-18 ENCOUNTER — Emergency Department: Payer: BLUE CROSS/BLUE SHIELD

## 2016-03-18 ENCOUNTER — Encounter: Payer: Self-pay | Admitting: Emergency Medicine

## 2016-03-18 DIAGNOSIS — O9989 Other specified diseases and conditions complicating pregnancy, childbirth and the puerperium: Secondary | ICD-10-CM | POA: Insufficient documentation

## 2016-03-18 DIAGNOSIS — Z3A01 Less than 8 weeks gestation of pregnancy: Secondary | ICD-10-CM | POA: Diagnosis not present

## 2016-03-18 DIAGNOSIS — O0281 Inappropriate change in quantitative human chorionic gonadotropin (hCG) in early pregnancy: Secondary | ICD-10-CM | POA: Insufficient documentation

## 2016-03-18 DIAGNOSIS — O26891 Other specified pregnancy related conditions, first trimester: Secondary | ICD-10-CM | POA: Insufficient documentation

## 2016-03-18 DIAGNOSIS — R1030 Lower abdominal pain, unspecified: Secondary | ICD-10-CM | POA: Diagnosis not present

## 2016-03-18 DIAGNOSIS — F17200 Nicotine dependence, unspecified, uncomplicated: Secondary | ICD-10-CM | POA: Insufficient documentation

## 2016-03-18 DIAGNOSIS — O99331 Smoking (tobacco) complicating pregnancy, first trimester: Secondary | ICD-10-CM | POA: Insufficient documentation

## 2016-03-18 DIAGNOSIS — R300 Dysuria: Secondary | ICD-10-CM | POA: Diagnosis not present

## 2016-03-18 DIAGNOSIS — Z3490 Encounter for supervision of normal pregnancy, unspecified, unspecified trimester: Secondary | ICD-10-CM

## 2016-03-18 LAB — URINALYSIS COMPLETE WITH MICROSCOPIC (ARMC ONLY)
BILIRUBIN URINE: NEGATIVE
Bacteria, UA: NONE SEEN
Glucose, UA: NEGATIVE mg/dL
Leukocytes, UA: NEGATIVE
Nitrite: NEGATIVE
PH: 5 (ref 5.0–8.0)
Protein, ur: 30 mg/dL — AB
Specific Gravity, Urine: 1.021 (ref 1.005–1.030)

## 2016-03-18 LAB — CBC
HCT: 39.6 % (ref 35.0–47.0)
Hemoglobin: 13.7 g/dL (ref 12.0–16.0)
MCH: 29.6 pg (ref 26.0–34.0)
MCHC: 34.7 g/dL (ref 32.0–36.0)
MCV: 85.3 fL (ref 80.0–100.0)
Platelets: 246 10*3/uL (ref 150–440)
RBC: 4.64 MIL/uL (ref 3.80–5.20)
RDW: 13.3 % (ref 11.5–14.5)
WBC: 9.4 10*3/uL (ref 3.6–11.0)

## 2016-03-18 LAB — COMPREHENSIVE METABOLIC PANEL
ALBUMIN: 4.4 g/dL (ref 3.5–5.0)
ALK PHOS: 60 U/L (ref 38–126)
ALT: 57 U/L — AB (ref 14–54)
AST: 66 U/L — AB (ref 15–41)
Anion gap: 12 (ref 5–15)
BUN: 7 mg/dL (ref 6–20)
CHLORIDE: 105 mmol/L (ref 101–111)
CO2: 21 mmol/L — AB (ref 22–32)
CREATININE: 0.58 mg/dL (ref 0.44–1.00)
Calcium: 9.3 mg/dL (ref 8.9–10.3)
GFR calc Af Amer: >60 mL/min (ref >60)
GFR calc non Af Amer: >60 mL/min (ref >60)
GLUCOSE: 122 mg/dL — AB (ref 65–99)
Potassium: 3 mmol/L — ABNORMAL LOW (ref 3.5–5.1)
SODIUM: 138 mmol/L (ref 135–145)
Total Bilirubin: 0.8 mg/dL (ref 0.3–1.2)
Total Protein: 8.5 g/dL — ABNORMAL HIGH (ref 6.5–8.1)

## 2016-03-18 LAB — HCG, QUANTITATIVE, PREGNANCY: hCG, Beta Chain, Quant, S: 1786 m[IU]/mL — ABNORMAL HIGH (ref <5)

## 2016-03-18 LAB — LIPASE, BLOOD: LIPASE: 23 U/L (ref 11–51)

## 2016-03-18 LAB — POCT PREGNANCY, URINE: PREG TEST UR: POSITIVE — AB

## 2016-03-18 NOTE — Discharge Instructions (Signed)
Please seek medical attention for any high fevers, chest pain, shortness of breath, change in behavior, persistent vomiting, bloody stool or any other new or concerning symptoms.  

## 2016-03-18 NOTE — ED Notes (Signed)
Interpreter has been notified.

## 2016-03-18 NOTE — ED Triage Notes (Signed)
Per interpreter pt presents with abd pain starting this am describes as cramping, denies any vaginal bleeding. Pt states she took two pregnancy tests on Friday on was positive.

## 2016-03-18 NOTE — ED Notes (Signed)
Patient is back from ultra sound.

## 2016-03-18 NOTE — ED Provider Notes (Signed)
[facility] Emergency Department Provider Note    ____________________________________________   I have reviewed the triage vital signs and the nursing notes.   HISTORY  Chief Complaint Abdominal Pain   History limited by: Not Limited   HPI Laura Mcguire is a 35 y.o. female who presents to the emergency department today because of concern for abdominal pain. It is located in the lower abdominal and bilateral lower quadrants. The pain started this morning. It has been constant since then. It is somewhat relieved by sitting up and applying pressure. She has had some associated nausea but no vomiting. She denies any abnormal vaginal bleeding or discharge. Slight discomfort with urination. No change in defecation. No fever. Took home pregnancy tests this weekend which were positive.   Past Medical History:  Diagnosis Date  . Migraines     Patient Active Problem List   Diagnosis Date Noted  . Right hemiparesis (HCC) 10/10/2015  . Headache 10/09/2015    Past Surgical History:  Procedure Laterality Date  . GALLBLADDER SURGERY      Prior to Admission medications   Medication Sig Start Date End Date Taking? Authorizing Provider  amitriptyline (ELAVIL) 25 MG tablet Take 1 tablet (25 mg total) by mouth at bedtime. 10/10/15   Enedina Finner, MD    Allergies Penicillins  Family History  Problem Relation Age of Onset  . Diabetes    . Stroke      Social History Social History  Substance Use Topics  . Smoking status: Current Every Day Smoker  . Smokeless tobacco: Never Used  . Alcohol use 0.0 oz/week     Comment: Socially     Review of Systems  Constitutional: Negative for fever. Cardiovascular: Negative for chest pain. Respiratory: Negative for shortness of breath. Gastrointestinal: Positive for abdominal pain. Genitourinary: Positive for dysuria.  Musculoskeletal: Negative for back pain. Skin: Negative for rash. Neurological:  Negative for headaches, focal weakness or numbness.  10-point ROS otherwise negative.  ____________________________________________   PHYSICAL EXAM:  VITAL SIGNS: ED Triage Vitals  Enc Vitals Group     BP 03/18/16 1210 118/70     Pulse Rate 03/18/16 1210 89     Resp 03/18/16 1210 20     Temp 03/18/16 1210 98.4 F (36.9 C)     Temp Source 03/18/16 1210 Oral     SpO2 03/18/16 1210 97 %     Weight 03/18/16 1211 138 lb (62.6 kg)     Height 03/18/16 1211 5\' 4"  (1.626 m)     Head Circumference --      Peak Flow --      Pain Score 03/18/16 1217 5   Constitutional: Alert and oriented. Well appearing and in no distress. Eyes: Conjunctivae are normal. Normal extraocular movements. ENT   Head: Normocephalic and atraumatic.   Nose: No congestion/rhinnorhea.   Mouth/Throat: Mucous membranes are moist.   Neck: No stridor. Hematological/Lymphatic/Immunilogical: No cervical lymphadenopathy. Cardiovascular: Normal rate, regular rhythm.  No murmurs, rubs, or gallops. Respiratory: Normal respiratory effort without tachypnea nor retractions. Breath sounds are clear and equal bilaterally. No wheezes/rales/rhonchi. Gastrointestinal: Soft and nontender. No distention.  Genitourinary: Deferred Musculoskeletal: Normal range of motion in all extremities. No lower extremity edema. Neurologic:  Normal speech and language. No gross focal neurologic deficits are appreciated.  Skin:  Skin is warm, dry and intact. No rash noted. Psychiatric: Mood and affect are normal. Speech and behavior are normal. Patient exhibits appropriate insight and judgment.  ____________________________________________  LABS (pertinent positives/negatives)  Labs Reviewed  COMPREHENSIVE METABOLIC PANEL - Abnormal; Notable for the following:       Result Value   Potassium 3.0 (*)    CO2 21 (*)    Glucose, Bld 122 (*)    Total Protein 8.5 (*)    AST 66 (*)    ALT 57 (*)    All other components within  normal limits  URINALYSIS COMPLETEWITH MICROSCOPIC ([facility] ONLY) - Abnormal; Notable for the following:    Color, Urine YELLOW (*)    APPearance CLEAR (*)    Ketones, ur 2+ (*)    Hgb urine dipstick 2+ (*)    Protein, ur 30 (*)    Squamous Epithelial / LPF 0-5 (*)    All other components within normal limits  HCG, QUANTITATIVE, PREGNANCY - Abnormal; Notable for the following:    hCG, Beta Chain, Quant, S 1,786 (*)    All other components within normal limits  POCT PREGNANCY, URINE - Abnormal; Notable for the following:    Preg Test, Ur POSITIVE (*)    All other components within normal limits  LIPASE, BLOOD  CBC  POC URINE PREG, ED     ____________________________________________   EKG  None  ____________________________________________    RADIOLOGY  US  IMPRESSION: 1. A small rounded fluid collection associated with the endometrium is too small to characterize but most likely an early gestational sac. A pseudo gestational sac is not completely excluded. Given the lack of definitive intrauterine pregnancy, early pregnancy, recent miscarriage, an ectopic pregnancy are all still possible although early pregnancy is favored. Recommend clinical correlation and follow-up as clinically warranted.  ____________________________________________   PROCEDURES  Procedures  ____________________________________________   INITIAL IMPRESSION / ASSESSMENT AND PLAN / ED COURSE  Pertinent labs & imaging results that were available during my care of the patient were reviewed by me and considered in my medical decision making (see chart for details).  Patient presents to the emergency department today with concerns for lower abdominal pain in the setting of early pregnancy. Ultrasound without definitive IUP however they do think there seen on her gestational sac within the endometrium. Blood work with very low beta hCG. At this point I think patient very early pregnancy. I did  discuss with patient need to follow up. Will give patient follow-up options. Patient denied any vaginal bleeding.  ____________________________________________   FINAL CLINICAL IMPRESSION(S) / ED DIAGNOSES  Final diagnoses:  Pregnancy     Note: This dictation was prepared with Dragon dictation. Any transcriptional errors that result from this process are unintentional    Phineas SemenGraydon Terisha Losasso, MD 03/18/16 [phone]

## 2016-03-18 NOTE — ED Notes (Signed)
Family is at bedside. °

## 2016-03-18 NOTE — ED Notes (Signed)
Interpreter Jacqui  At bedside.

## 2016-05-26 ENCOUNTER — Other Ambulatory Visit: Payer: Self-pay | Admitting: Obstetrics and Gynecology

## 2016-05-26 ENCOUNTER — Other Ambulatory Visit (HOSPITAL_COMMUNITY): Payer: Self-pay | Admitting: Family Medicine

## 2016-05-26 DIAGNOSIS — Z3482 Encounter for supervision of other normal pregnancy, second trimester: Secondary | ICD-10-CM

## 2016-05-27 ENCOUNTER — Other Ambulatory Visit (HOSPITAL_COMMUNITY): Payer: Self-pay | Admitting: Family Medicine

## 2016-05-27 DIAGNOSIS — Z3689 Encounter for other specified antenatal screening: Secondary | ICD-10-CM

## 2016-06-26 ENCOUNTER — Encounter: Payer: Self-pay | Admitting: *Deleted

## 2016-06-26 ENCOUNTER — Ambulatory Visit
Admission: RE | Admit: 2016-06-26 | Discharge: 2016-06-26 | Disposition: A | Discharge status: Home / Self Care | Payer: BLUE CROSS/BLUE SHIELD | Source: Ambulatory Visit | Attending: Maternal and Fetal Medicine | Admitting: Maternal and Fetal Medicine

## 2016-06-26 ENCOUNTER — Ambulatory Visit (HOSPITAL_BASED_OUTPATIENT_CLINIC_OR_DEPARTMENT_OTHER)
Admission: RE | Admit: 2016-06-26 | Discharge: 2016-06-26 | Disposition: A | Discharge status: Home / Self Care | Payer: BLUE CROSS/BLUE SHIELD | Source: Ambulatory Visit | Attending: Maternal and Fetal Medicine | Admitting: Maternal and Fetal Medicine

## 2016-06-26 VITALS — BP 111/79 | HR 84 | Temp 98.5°F | Wt 156.0 lb

## 2016-06-26 DIAGNOSIS — Z810 Family history of intellectual disabilities: Secondary | ICD-10-CM | POA: Diagnosis not present

## 2016-06-26 DIAGNOSIS — O09892 Supervision of other high risk pregnancies, second trimester: Secondary | ICD-10-CM | POA: Insufficient documentation

## 2016-06-26 DIAGNOSIS — Z3A19 19 weeks gestation of pregnancy: Secondary | ICD-10-CM | POA: Diagnosis not present

## 2016-06-26 DIAGNOSIS — Z3689 Encounter for other specified antenatal screening: Secondary | ICD-10-CM | POA: Diagnosis present

## 2016-06-26 DIAGNOSIS — Z818 Family history of other mental and behavioral disorders: Secondary | ICD-10-CM | POA: Diagnosis not present

## 2016-06-26 DIAGNOSIS — Z8489 Family history of other specified conditions: Secondary | ICD-10-CM | POA: Insufficient documentation

## 2016-06-26 NOTE — Progress Notes (Addendum)
Referring Provider:  [facility] 50 minute consultation  Laura Mcguire was referred to Laura Mcguire for genetic counseling due to a history of autism and developmental delay in her son.  This note summarizes the information we discussed.    We obtained a detailed family history and pregnancy history.  Laura Mcguire reported that this is the first pregnancy for she and her partner, Laura Mcguire. She reported no complications or exposures in this pregnancy that would be expected to increase the risk for birth defects.  The patient has three children from prior relationships, a 34 year old son and 587 year old daughter who are in good health.  She also has an 34 year old daughter with developmental differences and autism.  The patient stated that this daughter, Laura Mcguire (dob 11/12/2007)[facility] and has had genetic testing there which was all normal.  The remainder of the family history is remarkable for vision loss in three of Laura Mcguire's maternal uncles.  This happened in adulthood, but he is not aware of the cause for their vision loss.  We reviewed that because there are many reasons for vision problems, more information would be needed to assess the risks for other family members to have a similar condition.  There are no other family members with developmental differences, birth defects, known genetic conditions or recurrent pregnancy loss.  We then talked about Laura Mcguire's condition in more detail.  The term autism may include a spectrum of disorders from Asperger syndrome (with mild features) to more severe autistic features and developmental disabilities.  There may be many different causes for autism, some of which are inherited and others that are not well understood.  In order to determine the chance for this pregnancy or other family members to have a similar condition, one must first determine the cause for the condition in the affected family  member.  When the specific cause is not known, this risk assessment is much more difficult.  For this reason, we have requested records [facility].  When those records are obtained, we will contact Laura Mcguire and can consider other testing options including Fragile X testing if needed.  Among the inherited causes for autism spectrum disorders, developmental delay or mental retardation are single gene disorders, chromosome conditions, numerous syndromes and multifactorial conditions.  Each of these may have a different recurrence risk within a family.    The patient had previously declined maternal serum screening at [facility], as she feels like it may create more worry for her in the pregnancy.  She also declined CF and SMA carrier screening today after our counseling session.  An anatomy ultrasound was performed at the time of this visit.  The fetal anatomy was seen and appeared normal.  Please refer to that report for details.  The patient was encouraged to call with questions or concerns.  We can be contacted at [phone].  Laura Andersoneborah F. Cylinda Santoli, MS, CGC  Addendum 08/07/2016:  [facility]  Laura Mcguire has been evaluated for developmental delays/autism by Dr. Marlowe AschoffLynn Mcguire.  Labs studies showed normal female chromosome analysis and normal chromosomal microarray.  Per the medical records department, no formal medical genetics consultation notes were seen and no testing for single gene conditions such as Fragile X were in the record.  If additional testing were desired to help with assessment of Laura Mcguire, Laura Mcguire could  consider a medical genetics consult. We could also offer carrier screening to our patient for Fragile X syndrome to assess risks to this pregnancy, but that would not address the cause for developmental concerns in Laura Mcguire.  I called the patient with a Spanish interpreter and let her know this  information.  She does not desire any additional genetic testing in this pregnancy.

## 2016-07-02 ENCOUNTER — Emergency Department: Admission: EM | Admit: 2016-07-02 | Payer: Self-pay | Source: Home / Self Care

## 2016-07-02 ENCOUNTER — Encounter: Payer: Self-pay | Admitting: Emergency Medicine

## 2016-07-02 ENCOUNTER — Emergency Department
Admission: EM | Admit: 2016-07-02 | Discharge: 2016-07-02 | Disposition: A | Discharge status: Home / Self Care | Payer: BLUE CROSS/BLUE SHIELD | Source: Home / Self Care | Attending: Emergency Medicine | Admitting: Emergency Medicine

## 2016-07-02 ENCOUNTER — Observation Stay
Admission: EM | Admit: 2016-07-02 | Discharge: 2016-07-02 | Disposition: A | Discharge status: Home / Self Care | Payer: BLUE CROSS/BLUE SHIELD | Source: Ambulatory Visit | Attending: Obstetrics and Gynecology | Admitting: Obstetrics and Gynecology

## 2016-07-02 DIAGNOSIS — O209 Hemorrhage in early pregnancy, unspecified: Secondary | ICD-10-CM | POA: Insufficient documentation

## 2016-07-02 DIAGNOSIS — Z881 Allergy status to other antibiotic agents status: Secondary | ICD-10-CM | POA: Insufficient documentation

## 2016-07-02 DIAGNOSIS — G8190 Hemiplegia, unspecified affecting unspecified side: Secondary | ICD-10-CM | POA: Diagnosis not present

## 2016-07-02 DIAGNOSIS — Z3A20 20 weeks gestation of pregnancy: Secondary | ICD-10-CM

## 2016-07-02 DIAGNOSIS — Z880 Allergy status to penicillin: Secondary | ICD-10-CM | POA: Insufficient documentation

## 2016-07-02 DIAGNOSIS — Z8489 Family history of other specified conditions: Secondary | ICD-10-CM | POA: Diagnosis not present

## 2016-07-02 DIAGNOSIS — Z79899 Other long term (current) drug therapy: Secondary | ICD-10-CM | POA: Insufficient documentation

## 2016-07-02 DIAGNOSIS — Z3A19 19 weeks gestation of pregnancy: Secondary | ICD-10-CM | POA: Diagnosis not present

## 2016-07-02 DIAGNOSIS — G43909 Migraine, unspecified, not intractable, without status migrainosus: Secondary | ICD-10-CM | POA: Diagnosis not present

## 2016-07-02 DIAGNOSIS — O26892 Other specified pregnancy related conditions, second trimester: Secondary | ICD-10-CM | POA: Diagnosis not present

## 2016-07-02 DIAGNOSIS — O4690 Antepartum hemorrhage, unspecified, unspecified trimester: Secondary | ICD-10-CM

## 2016-07-02 DIAGNOSIS — O4692 Antepartum hemorrhage, unspecified, second trimester: Secondary | ICD-10-CM | POA: Diagnosis present

## 2016-07-02 LAB — URINALYSIS, ROUTINE W REFLEX MICROSCOPIC
Bilirubin Urine: NEGATIVE
Glucose, UA: NEGATIVE mg/dL
Hgb urine dipstick: NEGATIVE
Ketones, ur: 5 mg/dL — AB
LEUKOCYTES UA: NEGATIVE
Nitrite: NEGATIVE
PROTEIN: NEGATIVE mg/dL
Specific Gravity, Urine: 1.011 (ref 1.005–1.030)
pH: 7 (ref 5.0–8.0)

## 2016-07-02 LAB — CHLAMYDIA/NGC RT PCR (ARMC ONLY)
Chlamydia Tr: NOT DETECTED
N GONORRHOEAE: NOT DETECTED

## 2016-07-02 LAB — WET PREP, GENITAL
SPERM: NONE SEEN
Trich, Wet Prep: NONE SEEN
Yeast Wet Prep HPF POC: NONE SEEN

## 2016-07-02 MED ORDER — METRONIDAZOLE 500 MG PO TABS: 500.0000 mg | ORAL_TABLET | Freq: Two times a day (BID) | ORAL | 0 refills | Status: DC

## 2016-07-02 NOTE — ED Notes (Signed)
Patient in room getting undressed and a ua sample

## 2016-07-02 NOTE — OB Triage Note (Signed)
Patient presents from the ED @ 19 weeks 6 days with complaints of passing a penny sized clot when voiding this morning @ 0830.  Patient denies any leaking of fluid, pain, nausea, vomiting or diarrhea,  palpated fetal movement when obtaining FHR rate. FHR @ 145 via external fetal monitor.  Significant other present and supportive.  Dr. Valentino Saxonherry notified, will come to perform speculum.

## 2016-07-02 NOTE — Discharge Instructions (Signed)
Please seek medical attention for any high fevers, chest pain, shortness of breath, change in behavior, persistent vomiting, bloody stool or any other new or concerning symptoms.  

## 2016-07-02 NOTE — ED Provider Notes (Signed)
[facility] Emergency Department Provider Note  ____________________________________________   I have reviewed the triage vital signs and the nursing notes.   HISTORY  Chief Complaint Vaginal Bleeding   History limited by: Language [facility] utilized   HPI Laura Mcguire is a 35 y.o. female who presents to the emergency department today because of concerns for vaginal bleeding. Per the ultrasound that was performed last week patient is 20 weeks today. Patient states that she passed a small blood clot. She denies any associated abdominal pain or cramping. She states that she was told that her ultrasound showed a concerning placement of the placenta. No recent fevers or shortness of breath.   History reviewed. No pertinent past medical history.  There are no active problems to display for this patient.   History reviewed. No pertinent surgical history.  Prior to Admission medications   Not on File    Allergies Patient has no known allergies.  No family history on file.  Social History Social History  Substance Use Topics  . Smoking status: Never Smoker  . Smokeless tobacco: Never Used  . Alcohol use Not on file    Review of Systems  Constitutional: Negative for fever. Cardiovascular: Negative for chest pain. Respiratory: Negative for shortness of breath. Gastrointestinal: Negative for abdominal pain, vomiting and diarrhea. Neurological: Negative for headaches, focal weakness or numbness.  10-point ROS otherwise negative.  ____________________________________________   PHYSICAL EXAM:  VITAL SIGNS: ED Triage Vitals  Enc Vitals Group     BP 07/02/16 0921 111/71     Pulse --      Resp 07/02/16 0921 15     Temp 07/02/16 0921 97.6 F (36.4 C)     Temp Source 07/02/16 0921 Oral     SpO2 07/02/16 0921 98 %     Weight 07/02/16 0914 160 lb (72.6 kg)     Height 07/02/16 0914 5\' 4"  (1.626 m)   Constitutional: Alert  and oriented. Well appearing and in no distress. Eyes: Conjunctivae are normal. Normal extraocular movements. ENT   Head: Normocephalic and atraumatic.   Nose: No congestion/rhinnorhea.   Mouth/Throat: Mucous membranes are moist.   Neck: No stridor. Hematological/Lymphatic/Immunilogical: No cervical lymphadenopathy. Cardiovascular: Normal rate, regular rhythm.  No murmurs, rubs, or gallops.  Respiratory: Normal respiratory effort without tachypnea nor retractions. Breath sounds are clear and equal bilaterally. No wheezes/rales/rhonchi. Gastrointestinal: Soft and gravid. No rebound. No guarding.  Genitourinary: Deferred Musculoskeletal: Normal range of motion in all extremities. No lower extremity edema. Neurologic:  Normal speech and language. No gross focal neurologic deficits are appreciated.  Skin:  Skin is warm, dry and intact. No rash noted. Psychiatric: Mood and affect are normal. Speech and behavior are normal. Patient exhibits appropriate insight and judgment.  ____________________________________________    LABS (pertinent positives/negatives)  None  ____________________________________________   EKG  None  ____________________________________________    RADIOLOGY  None  ____________________________________________   PROCEDURES  Procedures  ____________________________________________   INITIAL IMPRESSION / ASSESSMENT AND PLAN / ED COURSE  Pertinent labs & imaging results that were available during my care of the patient were reviewed by me and considered in my medical decision making (see chart for details).  Patient with vaginal bleeding in pregnancy. Patient is 20 weeks. Will go to labor and delivery.  ____________________________________________   FINAL CLINICAL IMPRESSION(S) / ED DIAGNOSES  Vaginal bleeding in pregnancy  Note: This dictation was prepared with Dragon dictation. Any transcriptional errors that result from this  process are  unintentional      Phineas SemenGraydon Kiandre Spagnolo, MD 07/02/16 1023

## 2016-07-02 NOTE — ED Notes (Signed)
[facility]

## 2016-07-02 NOTE — ED Triage Notes (Signed)
Pt reports vaginal bleeding.  19wks and 6 days preg.  G4.

## 2016-07-02 NOTE — Final Progress Note (Signed)
L&D OB Triage Note  HPI:  Laura Mcguire is a 35 y.o. G5P0 female at 6563w6d. Estimated Date of Delivery: 11/10/16 who presents for complaints of passing penny-sized clot when voiding this morning.  Reports no prior h/o vaginal bleeding.  Patient is normally seen at [facility].  Denies vaginal discharge, pelvic pain or contractions, leaking fluid. Notes good fetal movement. Of note, patient reports recent anatomy scan with "low-lying placenta".     OB History  Gravida Para Term Preterm AB Living  5         3  SAB TAB Ectopic Multiple Live Births               # Outcome Date GA Lbr Len/2nd Weight Sex Delivery Anes PTL Lv  5 Current           4 Gravida           3 Gravida           2 Gravida           1 Gravida               Patient Active Problem List   Diagnosis Date Noted  . Vaginal bleeding in pregnancy, second trimester 07/02/2016  . Family history of autism   . Right hemiparesis (HCC) 10/10/2015  . Headache 10/09/2015    Past Medical History:  Diagnosis Date  . Migraines     No current facility-administered medications on file prior to encounter.    Current Outpatient Prescriptions on File Prior to Encounter  Medication Sig Dispense Refill  . Prenatal Vit-Fe Fumarate-FA (MULTIVITAMIN-PRENATAL) 27-0.8 MG TABS tablet Take 1 tablet by mouth daily at 12 noon.      Allergies  Allergen Reactions  . Ampicillin Hives and Swelling  . Penicillins Hives and Swelling    Has patient had a PCN reaction causing immediate rash, facial/tongue/throat swelling, SOB or lightheadedness with hypotension: yes Has patient had a PCN reaction causing severe rash involving mucus membranes or skin necrosis: {no Has patient had a PCN reaction that required hospitalization no Has patient had a PCN reaction occurring within the last 10 years: no If all of the above answers are "NO", then may proceed with Cephalosporin use.      ROS:  Review of Systems - Negative except what is noted in  HPI   Physical Exam:  Blood Pressure 111/71  Temp 97.6 Celsius   Respirations 15  Pulse 98  Last menstrual period 02/04/2016. General appearance: alert and no distress Abdomen: soft, non-tender; bowel sounds normal; no masses,  no organomegaly, gravid Pelvic: external genitalia normal, rectovaginal septum normal.  Vagina moderate amount of white thin discharge.  No blood or vaginal lesions. Cervix normal appearing, no lesions and no motion tenderness. Cervix closed.  Extremities: extremities normal, atraumatic, no cyanosis or edema   FHT assessed, 152 bpm.  Tocometer not performed.     Labs:  Results for orders placed or performed during the hospital encounter of 07/02/16  Wet prep, genital  Result Value Ref Range   Yeast Wet Prep HPF POC NONE SEEN NONE SEEN   Trich, Wet Prep NONE SEEN NONE SEEN   Clue Cells Wet Prep HPF POC PRESENT (A) NONE SEEN   WBC, Wet Prep HPF POC MODERATE (A) NONE SEEN   Sperm NONE SEEN   Chlamydia/NGC rt PCR ([facility] only)  Result Value Ref Range   Specimen source GC/Chlam URINE, RANDOM    Chlamydia Tr NOT DETECTED NOT DETECTED  N gonorrhoeae NOT DETECTED NOT DETECTED  Urinalysis, Routine w reflex microscopic  Result Value Ref Range   Color, Urine STRAW (A) YELLOW   APPearance CLEAR (A) CLEAR   Specific Gravity, Urine 1.011 1.005 - 1.030   pH 7.0 5.0 - 8.0   Glucose, UA NEGATIVE NEGATIVE mg/dL   Hgb urine dipstick NEGATIVE NEGATIVE   Bilirubin Urine NEGATIVE NEGATIVE   Ketones, ur 5 (A) NEGATIVE mg/dL   Protein, ur NEGATIVE NEGATIVE mg/dL   Nitrite NEGATIVE NEGATIVE   Leukocytes, UA NEGATIVE NEGATIVE    Assessment:  35 y.o. G5P0 at 3463w2d with:  1.  Vaginal bleeding in pregnancy 2. Low-lying placenta 3. Bacterial vaginosis    Plan:  1. Light bleeding x 1 episode, when voiding.  UA negative for UTI.  May be secondary to vaginitis or low-lying placenta. Advised on pelvic rest until next visit.  2. Will treat bacterial vaginosis with Flagyl 500  mg BID x 7 days.   3. Patient able to d/c home. To f/u with [facility] 07/09/2016 for routine OB visit.   Laura LaserAnika Cleatis Fandrich, MD [facility]

## 2016-07-02 NOTE — Plan of Care (Signed)
Dr Valentino Saxoncherry notfied of u/a results and wet prep results. [facility]. Instructions given to pt in spanish. Verbalizes understanding. Pt discharged to home with d/c instructions

## 2016-07-02 NOTE — Plan of Care (Signed)
Dr Valentino Saxoncherry here to see pt

## 2016-07-02 NOTE — Discharge Instructions (Signed)
Vaginosis bacteriana (Bacterial Vaginosis) La vaginosis bacteriana es una infeccin vaginal que perturba el equilibrio normal de las bacterias que se encuentran en la vagina. Es el resultado de un crecimiento excesivo de ciertas bacterias. Esta es la infeccin vaginal ms frecuente en mujeres en edad reproductiva. El tratamiento es importante para prevenir complicaciones, especialmente en mujeres embarazadas, dado que puede causar un parto prematuro. CAUSAS La vaginosis bacteriana se origina por un aumento de bacterias nocivas que, generalmente, estn presentes en cantidades ms pequeas en la vagina. Varios tipos diferentes de bacterias pueden causar esta afeccin. Sin embargo, la causa de su desarrollo no se comprende totalmente. FACTORES DE RIESGO Ciertas actividades o comportamientos pueden exponerlo a un mayor riesgo de desarrollar vaginosis bacteriana, entre los que se incluyen:  Tener una nueva pareja sexual o mltiples parejas sexuales.  Las duchas vaginales  El uso del DIU (dispositivo intrauterino) como mtodo anticonceptivo. El contagio no se produce en baos, por ropas de cama, en piscinas o por contacto con objetos. SIGNOS Y SNTOMAS Algunas mujeres que padecen vaginosis bacteriana no presentan signos ni sntomas. Los sntomas ms comunes son:  Secrecin vaginal de color grisceo.  Secrecin vaginal con olor similar al pescado, especialmente despus de mantener relaciones sexuales.  Picazn o sensacin de ardor en la vagina o la vulva.  Ardor o dolor al orinar. DIAGNSTICO Su mdico analizar su historia clnica y le examinar la vagina para detectar signos de vaginosis bacteriana. Puede tomarle una muestra de flujo vaginal. Su mdico examinar esta muestra con un microscopio para controlar las bacterias y clulas anormales. Tambin puede realizarse un anlisis del pH vaginal. TRATAMIENTO La vaginosis bacteriana puede tratarse con antibiticos, en forma de comprimidos o de  crema vaginal. Puede indicarse una segunda tanda de antibiticos si la afeccin se repite despus del tratamiento. Debido a que la vaginosis bacteriana aumenta el riesgo de contraer enfermedades de transmisin sexual, el tratamiento puede ayudar a reducir el riesgo de clamidia, gonorrea, VIH y herpes. INSTRUCCIONES PARA EL CUIDADO EN EL HOGAR  Tome solo medicamentos de venta libre o recetados, segn las indicaciones del mdico.  Si le han recetado antibiticos, tmelos como se le indic. Asegrese de que finaliza la prescripcin completa aunque se sienta mejor.  Comunique a sus compaeros sexuales que sufre una infeccin vaginal. Deben consultar a su mdico y recibir tratamiento si tienen problemas, como picazn o una erupcin cutnea leve.  Durante el tratamiento, es importante que siga estas indicaciones:  Evite mantener relaciones sexuales o use preservativos de la forma correcta.  No se haga duchas vaginales.  Evite consumir alcohol como se lo haya indicado el mdico.  Evite amamantar como se lo haya indicado el mdico. SOLICITE ATENCIN MDICA SI:  Sus sntomas no mejoran despus de 3 das de tratamiento.  Aumenta la secrecin o el dolor.  Tiene fiebre. ASEGRESE DE QUE:  Comprende estas instrucciones.  Controlar su afeccin.  Recibir ayuda de inmediato si no mejora o si empeora. PARA OBTENER MS INFORMACIN Centros para el control y la prevencin de enfermedades (Centers for Disease Control and Prevention, CDC): www.cdc.gov/std Asociacin Estadounidense de la Salud Sexual (American Sexual Health Association, SHA): www.ashastd.org Esta informacin no tiene como fin reemplazar el consejo del mdico. Asegrese de hacerle al mdico cualquier pregunta que tenga. Document Released: 09/30/2007 Document Revised: 07/14/2014 Document Reviewed: 02/02/2013 Elsevier Interactive Patient Education  2017 Elsevier Inc.  

## 2016-07-02 NOTE — ED Notes (Signed)
ED Provider at bedside with this RN and interpreter

## 2016-07-03 ENCOUNTER — Encounter (HOSPITAL_COMMUNITY): Payer: Self-pay

## 2016-07-03 ENCOUNTER — Encounter: Payer: Self-pay | Admitting: Emergency Medicine

## 2016-07-07 NOTE — L&D Delivery Note (Signed)
Date of delivery: 11/09/16 Estimated Date of Delivery: 11/20/16 Patient's last menstrual period was 02/04/2016. EGA: 4293w3d  Delivery Note At 6:18 AM a viable female was delivered via Vaginal, Spontaneous Delivery (Presentation: ROA).  APGAR: 9, 9 ; weight pending Placenta status: spontaneous, intact.  Cord: 3 vessels, with the following complications: loose nuchal x1.  Cord pH: not collected  Anesthesia:  epidural Episiotomy: None Lacerations: None Suture Repair: n/a Est. Blood Loss (mL):  300cc  Mom presented to L&D with ROM and was found to be 4cm.  epidual placed. Progressed to complete, second stage:  delivery of fetal head with restitution to LOT.   Anterior then posterior shoulders delivered without difficulty.  Baby placed on mom's chest, and attended to by peds.  We sang happy birthday to baby Hanscom AFBPablo.  Cord was then clamped and cut when pulseless.  Placenta spontaneously delivered, intact.   IV pitocin given for hemorrhage prophylaxis.  Mom to postpartum.  Baby to Couplet care / Skin to Skin.  Chelsea C Ward 11/09/2016, 9:22 AM

## 2016-08-07 NOTE — Addendum Note (Signed)
Encounter addended by: Katrina Stackeborah Amparo Donalson on: 08/07/2016 12:15 PM<BR>    Actions taken: Sign clinical note

## 2016-08-07 NOTE — Addendum Note (Signed)
Encounter addended by: Katrina Stackeborah Lewie Deman on: 08/07/2016  2:10 PM<BR>    Actions taken: Sign clinical note

## 2016-09-22 ENCOUNTER — Other Ambulatory Visit: Payer: Self-pay | Admitting: *Deleted

## 2016-09-22 DIAGNOSIS — O4403 Placenta previa specified as without hemorrhage, third trimester: Secondary | ICD-10-CM

## 2016-09-25 ENCOUNTER — Ambulatory Visit
Admission: RE | Admit: 2016-09-25 | Discharge: 2016-09-25 | Disposition: A | Discharge status: Home / Self Care | Payer: BLUE CROSS/BLUE SHIELD | Source: Ambulatory Visit | Attending: Maternal and Fetal Medicine | Admitting: Maternal and Fetal Medicine

## 2016-09-25 DIAGNOSIS — Z3A32 32 weeks gestation of pregnancy: Secondary | ICD-10-CM | POA: Diagnosis not present

## 2016-09-25 DIAGNOSIS — O4403 Placenta previa specified as without hemorrhage, third trimester: Secondary | ICD-10-CM | POA: Insufficient documentation

## 2016-09-25 HISTORY — DX: Anemia, unspecified: D64.9

## 2016-10-13 ENCOUNTER — Observation Stay
Admission: EM | Admit: 2016-10-13 | Discharge: 2016-10-14 | Disposition: A | Discharge status: Home / Self Care | Payer: BLUE CROSS/BLUE SHIELD | Attending: Obstetrics and Gynecology | Admitting: Obstetrics and Gynecology

## 2016-10-13 ENCOUNTER — Encounter: Payer: Self-pay | Admitting: *Deleted

## 2016-10-13 DIAGNOSIS — Z720 Tobacco use: Secondary | ICD-10-CM | POA: Insufficient documentation

## 2016-10-13 DIAGNOSIS — O4693 Antepartum hemorrhage, unspecified, third trimester: Principal | ICD-10-CM | POA: Insufficient documentation

## 2016-10-13 DIAGNOSIS — N939 Abnormal uterine and vaginal bleeding, unspecified: Secondary | ICD-10-CM

## 2016-10-13 DIAGNOSIS — Z3A34 34 weeks gestation of pregnancy: Secondary | ICD-10-CM | POA: Diagnosis not present

## 2016-10-13 DIAGNOSIS — Z880 Allergy status to penicillin: Secondary | ICD-10-CM | POA: Diagnosis not present

## 2016-10-13 DIAGNOSIS — F329 Major depressive disorder, single episode, unspecified: Secondary | ICD-10-CM | POA: Insufficient documentation

## 2016-10-13 DIAGNOSIS — G43909 Migraine, unspecified, not intractable, without status migrainosus: Secondary | ICD-10-CM | POA: Insufficient documentation

## 2016-10-13 LAB — CBC WITH DIFFERENTIAL/PLATELET
BASOS ABS: 0 10*3/uL (ref 0–0.1)
BASOS PCT: 1 %
EOS PCT: 1 %
Eosinophils Absolute: 0 10*3/uL (ref 0–0.7)
HCT: 33.4 % — ABNORMAL LOW (ref 35.0–47.0)
Hemoglobin: 11 g/dL — ABNORMAL LOW (ref 12.0–16.0)
Lymphocytes Relative: 28 %
Lymphs Abs: 2.1 10*3/uL (ref 1.0–3.6)
MCH: 26 pg (ref 26.0–34.0)
MCHC: 33 g/dL (ref 32.0–36.0)
MCV: 78.6 fL — ABNORMAL LOW (ref 80.0–100.0)
MONO ABS: 0.8 10*3/uL (ref 0.2–0.9)
Monocytes Relative: 10 %
Neutro Abs: 4.6 10*3/uL (ref 1.4–6.5)
Neutrophils Relative %: 60 %
PLATELETS: 235 10*3/uL (ref 150–440)
RBC: 4.25 MIL/uL (ref 3.80–5.20)
RDW: 14.6 % — AB (ref 11.5–14.5)
WBC: 7.6 10*3/uL (ref 3.6–11.0)

## 2016-10-13 MED ORDER — ZOLPIDEM TARTRATE 5 MG PO TABS
5.0000 mg | ORAL_TABLET | Freq: Every evening | ORAL | Status: DC | PRN
  Administered 2016-10-13: 5 mg via ORAL
  Filled 2016-10-13: qty 1

## 2016-10-13 NOTE — Progress Notes (Signed)
Patient ID: Laura Mcguire, female   DOB: [birth year], 36 y.o.   MRN: [id number]  Laura Mcguire is a 36 y.o. female. She is at [redacted]w[redacted]d gestation. Patient's last menstrual period was 02/04/2016. Estimated Date of Delivery: 11/10/16  Prenatal care site: [facility] Dept Chief Complaint: vaginal bleeding  Started today , unprovoked . H/O marginal placental previa with more recent u/s 09/25/16 that shows no previa . Some pelvic pressure cramping  BT A+  Pregnancy complicated by: anemia , PiCA, etoh  Use in early pregnancy , tobacco use , depression , + PPD   Maternal Medical History:   Past Medical History:  Diagnosis Date  . Anemia   . Migraines     Past Surgical History:  Procedure Laterality Date  . GALLBLADDER SURGERY      Allergies  Allergen Reactions  . Ampicillin Hives and Swelling  . Penicillins Hives and Swelling    Has patient had a PCN reaction causing immediate rash, facial/tongue/throat swelling, SOB or lightheadedness with hypotension: yes Has patient had a PCN reaction causing severe rash involving mucus membranes or skin necrosis: {no Has patient had a PCN reaction that required hospitalization no Has patient had a PCN reaction occurring within the last 10 years: no If all of the above answers are "NO", then may proceed with Cephalosporin use.   Marland Kitchen Penicillins Hives and Swelling    Has patient had a PCN reaction causing immediate rash, facial/tongue/throat swelling, SOB or lightheadedness with hypotension: Yes Has patient had a PCN reaction causing severe rash involving mucus membranes or skin necrosis: No Has patient had a PCN reaction that required hospitalization: No Has patient had a PCN reaction occurring within the last 10 years: No If all of the above answers are "NO", then may proceed with Cephalosporin use.     Prior to Admission medications   Medication Sig Start Date End Date Taking? Authorizing Provider  ferrous sulfate 325  (65 FE) MG tablet Take 325 mg by mouth daily with breakfast.    Historical Provider, MD  Prenatal Vit-Fe Fumarate-FA (MULTIVITAMIN-PRENATAL) 27-0.8 MG TABS tablet Take 1 tablet by mouth daily at 12 noon.    Historical Provider, MD     Social History: She  reports that she has never smoked. She has never used smokeless tobacco. She reports that she does not drink alcohol or use drugs.  Family History: family history is not on file.  no history of gyn cancers  Review of Systems: A full review of systems was performed and negative except as noted in the HPI.   Psychiatry : + depression  Hematologic : anemia  GYN : abnormal pap   O:  Temp 98.5 F (36.9 C) (Oral)   Resp 19   LMP 02/04/2016  No results found for this or any previous visit (from the past 48 hour(s)).   Constitutional: NAD, AAOx3  HE/ENT: extraocular movements grossly intact, moist mucous membranes CV: RRR PULM: nl respiratory effort, CTABL     Abd: gravid, non-tender, non-distended, soft      Ext: Non-tender, Nonedmeatous   Psych: mood appropriate, speech normal Pelvic:cervix closed/ 50% / + blood on examining glove   FHT: 125  NST reactive , no decels , uterine irritability    [place] [facility] Ob Follow Up  Result Date: 09/25/2016 ----------------------------------------------------------------------  OBSTETRICS REPORT                      (Signed Final 09/25/2016 04:24 pm) ----------------------------------------------------------------------  PATIENT INFO:  ID #:       [id number]                          D.O.B.:  08/28/1980 (35 yrs)  Name:       Laura Mcguire                 Visit Date: 09/25/2016 04:09 pm              Laura Mcguire ---------------------------------------------------------------------- PERFORMED BY:  Performed By:     Lenoria Farrier            Referred By:       Federico Flake ----------------------------------------------------------------------  SERVICE(S) PROVIDED:   [place] MFM OB FOLLOW UP                                   76816.01  ---------------------------------------------------------------------- INDICATIONS:   [redacted] weeks gestation of pregnancy                Z3A.32   Placental previa  ---------------------------------------------------------------------- FETAL EVALUATION:  Num Of Fetuses:     1  Fetal Heart         143  Rate(bpm):  Presentation:       Cephalic  Placenta:           Anterior Grade 1, No previa  AFI Sum(cm)     %Tile       Largest Pocket(cm)  18.02           67          6.33  RUQ(cm)       RLQ(cm)       LUQ(cm)        LLQ(cm)  5.74          2.71          3.24           6.33 ---------------------------------------------------------------------- BIOMETRY:  BPD:      73.5  mm     G. Age:  29w 4d          1  %    CI:        67.72   %    70 - 86                                                          FL/HC:       20.8  %    19.1 - 21.3  HC:      285.8  mm     G. Age:  31w 3d          7  %    HC/AC:       1.02       0.96 - 1.17  AC:      279.3  mm  G. Age:  32w 0d         48  %    FL/BPD:      81.0  %    71 - 87  FL:       59.5  mm     G. Age:  31w 0d         15  %    FL/AC:       21.3  %    20 - 24  HUM:      54.3  mm     G. Age:  31w 4d         42  %  Est. FW:    1761   gm   3 lb 14 oz      27  % ---------------------------------------------------------------------- GESTATIONAL AGE:  U/S Today:     31w 0d                                        EDD:   11/27/16  Best:          32w 0d     Det. By:  Previous Ultrasound      EDD:   11/20/16 ---------------------------------------------------------------------- ANATOMY:  Cavum:                 Visualized             Stomach:                Seen                         previously  Ventricles:            Normal appearance      Abdominal Wall:         Visualized                                                                        previously  Cerebellum:            Visualized             Cord  Vessels:           3 vessels,                         previously                                     visualized previously  Posterior Fossa:       Visualized             Kidneys:                Normal appearance                         previously  Face:                  Orbits visualized      Bladder:  Seen                         previously  Lips:                  Visualized             Spine:                  Visualized                         previously                                     previously  Heart:                 4-Chamber view         Upper Extremities:      Visualized                         appears normal                                 previously  RVOT:                  Normal appearance      Lower Extremities:      Visualized                                                                        previously  LVOT:                  Normal appearance ---------------------------------------------------------------------- CERVIX UTERUS ADNEXA:  Cervix  Length:           3.62  cm. ---------------------------------------------------------------------- IMPRESSION:  Dear Dr. Alvester Morin,  Thank you for referring your patient to [facility] to  evaluate the location of the placenta.  A singleton gestation was noted with normal amniotic fluid  volume.  The fetal biometry correlates with established dating, using  the mid-trimester scan as the dating scan since LMP was  uncertain gives an EDD of 11/20/16.  Adequate interval growth noted.  The estimated fetal weight is at the 27  percentile.   The placenta was noted to be a Anterior, No previa  measuring   >5 cm away from the cervical os.  Thank you for involving [place] in the care of your patient.  If you  have any questions, please do not hesitate to contact us.  Artemio Aly, MD ----------------------------------------------------------------------                 Artemio Aly, MD Electronically Signed Final Report   09/25/2016 04:24 pm  ----------------------------------------------------------------------   A/P:  Vaginal bleeding , possibly from lowlying placenta  Reassuring fetal monitoring .   Labor: not present.   Fetal Wellbeing: Reassuring Cat 1 tracing.   CBC  And monitor tonight  ----- Ihor Austin Markita Stcharles MD Attending Obstetrician and Gynecologist [facility], Department of OB/GYN [facility]

## 2016-10-13 NOTE — OB Triage Note (Signed)
Presents with complaint of bleeding. No active bleeding noted now. Pt did not wear a pad in.  States there was a little bit in her panties when she got here. Denies sexual intercourse. States she is having a little bit of pain.

## 2016-10-14 ENCOUNTER — Observation Stay: Payer: BLUE CROSS/BLUE SHIELD

## 2016-10-14 DIAGNOSIS — O4693 Antepartum hemorrhage, unspecified, third trimester: Secondary | ICD-10-CM | POA: Diagnosis not present

## 2016-10-14 NOTE — Progress Notes (Signed)
Patient given discharge instructions in spanish, translated per E. Leticia Clas RN, including preterm labor precautions, restrictions, bleeding during pregnancy, fetal kick count instructions, and follow up information. Patient and spouse verbalized understanding and all questions answered. Patient discharged via wheelchair in stable condition accompanied by L. Roxan Hockey, NT.

## 2016-10-14 NOTE — Progress Notes (Signed)
Patient currently under observation for vaginal bleeding.  34.5 today. History of resolved marginal placenta, on 3/22 was >5cm from os  BP 107/66 (BP Location: Left Arm)   Pulse 72   Temp 98.2 F (36.8 C) (Oral)   Resp 18   LMP 02/04/2016    Ultrasound today did not show significant bleeding behind placenta or any other abnormality.  Bleeding has mostly resolved but still bright red with wiping.  Will continue observation until bleeding stops.  Can transfer to MB floor with daily NST while inpatient.  ----- Ranae Plumber, MD Attending Obstetrician and Gynecologist [facility], Department of OB/GYN [facility]

## 2016-10-14 NOTE — Progress Notes (Signed)
Pt stated that she has not been seen by MD today and would like to know what the plan is.  Dr. Elesa Massed paged. Dr. Elesa Massed stated that she thought she had been seen by Dr. Feliberto Gottron this am and to apologize to the patient.  Dr. Elesa Massed stated that if patient is no longer having bright red bleeding on toilet paper after voiding, to call her and she will discharge patient.  Otherwise, the patient will remain under observation on mother/baby unit.

## 2016-10-14 NOTE — Discharge Summary (Signed)
Patient ID: Laura Mcguire MRN: [id number] DOB/AGE: 1981-04-29 36 y.o.  Admit date: 10/13/2016 Discharge date: 10/14/2016  Patient's last menstrual period was 02/04/2016.  Estimated Date of Delivery: 11/20/16    Admission Diagnoses: vaginal bleeding In 3rd trimester  Discharge Diagnoses:  Vaginal bleeding in 3rd trimester  Prenatal Procedures: NST, ultrasound  Consults: none  Significant Diagnostic Studies:  Results for orders placed or performed during the hospital encounter of 10/13/16 (from the past 168 hour(s))  CBC with Differential/Platelet   Collection Time: 10/13/16  8:51 PM  Result Value Ref Range   WBC 7.6 3.6 - 11.0 K/uL   RBC 4.25 3.80 - 5.20 MIL/uL   Hemoglobin 11.0 (L) 12.0 - 16.0 g/dL   HCT 30.8 (L) 65.7 - 84.6 %   MCV 78.6 (L) 80.0 - 100.0 fL   MCH 26.0 26.0 - 34.0 pg   MCHC 33.0 32.0 - 36.0 g/dL   RDW 96.2 (H) 95.2 - 84.1 %   Platelets 235 150 - 440 K/uL   Neutrophils Relative % 60 %   Neutro Abs 4.6 1.4 - 6.5 K/uL   Lymphocytes Relative 28 %   Lymphs Abs 2.1 1.0 - 3.6 K/uL   Monocytes Relative 10 %   Monocytes Absolute 0.8 0.2 - 0.9 K/uL   Eosinophils Relative 1 %   Eosinophils Absolute 0.0 0 - 0.7 K/uL   Basophils Relative 1 %   Basophils Absolute 0.0 0 - 0.1 K/uL    Treatments: none  Hospital Course:  This is a 36 y.o. L2G4010 with IUP at [redacted]w[redacted]d admitted for vaginal bleeding, noted to have a cervical exam of closed/50%, high with blood on glove.  She was observed, fetal heart rate monitoring remained reassuring, NST reactive, and she had no signs/symptoms of progressing preterm labor or other maternal-fetal concerns.  Her cervical exam was unchanged from admission, ultrasound showed no placenta separation, and her bleeding resolved.  She was deemed stable for discharge to home with outpatient follow up.  Discharge Physical Exam: BP (!) 98/47   Pulse 80   Temp 98.3 F (36.8 C) (Oral)   Resp 18   LMP 02/04/2016   SpO2 99%   General:  NAD CV: RRR Pulm: CTABL, nl effort ABD: s/nd/nt, gravid DVT Evaluation: LE non-ttp, no evidence of DVT on exam.   Discharge Condition: Stable  Disposition: 01-Home or Self Care  Discharge Instructions    Do not have sex or do anything that might make you have an orgasm    Complete by:  As directed    Fetal Kick Count:  Lie on our left side for one hour after a meal, and count the number of times your baby kicks.  If it is less than 5 times, get up, move around and drink some juice.  Repeat the test 30 minutes later.  If it is still less than 5 kicks in an hour, notify your doctor.    Complete by:  As directed    Notify physician for a general feeling that "something is not right"    Complete by:  As directed    Notify physician for increase or change in vaginal discharge    Complete by:  As directed    Notify physician for intestinal cramps, with or without diarrhea, sometimes described as "gas pain"    Complete by:  As directed    Notify physician for leaking of fluid    Complete by:  As directed    Notify physician for low, dull  backache, unrelieved by heat or Tylenol    Complete by:  As directed    Notify physician for menstrual like cramps    Complete by:  As directed    Notify physician for pelvic pressure    Complete by:  As directed    Notify physician for uterine contractions.  These may be painless and feel like the uterus is tightening or the baby is  "balling up"    Complete by:  As directed    Notify physician for vaginal bleeding    Complete by:  As directed    PRETERM LABOR:  Includes any of the follwing symptoms that occur between 20 - [redacted] weeks gestation.  If these symptoms are not stopped, preterm labor can result in preterm delivery, placing your baby at risk    Complete by:  As directed      Allergies as of 10/14/2016      Reactions   Ampicillin Hives, Swelling   Penicillins Hives, Swelling   Has patient had a PCN reaction causing immediate rash,  facial/tongue/throat swelling, SOB or lightheadedness with hypotension: yes Has patient had a PCN reaction causing severe rash involving mucus membranes or skin necrosis: {no Has patient had a PCN reaction that required hospitalization no Has patient had a PCN reaction occurring within the last 10 years: no If all of the above answers are "NO", then may proceed with Cephalosporin use.   Penicillins Hives, Swelling   Has patient had a PCN reaction causing immediate rash, facial/tongue/throat swelling, SOB or lightheadedness with hypotension: Yes Has patient had a PCN reaction causing severe rash involving mucus membranes or skin necrosis: No Has patient had a PCN reaction that required hospitalization: No Has patient had a PCN reaction occurring within the last 10 years: No If all of the above answers are "NO", then may proceed with Cephalosporin use.      Medication List    TAKE these medications   ferrous sulfate 325 (65 FE) MG tablet Take 325 mg by mouth daily with breakfast.   multivitamin-prenatal 27-0.8 MG Tabs tablet Take 1 tablet by mouth daily at 12 noon.      Follow-up Information    [facility] Department. Go to.   Why:  Keep regularly scheduled appointment. Contact information: [address] [place] [address] [phone]           Signed: ----- Ranae Plumber, MD Attending Obstetrician and Gynecologist [facility]

## 2016-11-08 ENCOUNTER — Inpatient Hospital Stay
Admission: EM | Admit: 2016-11-08 | Discharge: 2016-11-10 | DRG: 775 | Disposition: A | Discharge status: Home / Self Care | Payer: BLUE CROSS/BLUE SHIELD | Attending: Obstetrics & Gynecology | Admitting: Obstetrics & Gynecology

## 2016-11-08 DIAGNOSIS — O99334 Smoking (tobacco) complicating childbirth: Secondary | ICD-10-CM | POA: Diagnosis present

## 2016-11-08 DIAGNOSIS — Z3A38 38 weeks gestation of pregnancy: Secondary | ICD-10-CM

## 2016-11-08 DIAGNOSIS — Z880 Allergy status to penicillin: Secondary | ICD-10-CM

## 2016-11-08 DIAGNOSIS — Z3490 Encounter for supervision of normal pregnancy, unspecified, unspecified trimester: Secondary | ICD-10-CM

## 2016-11-08 DIAGNOSIS — Z3493 Encounter for supervision of normal pregnancy, unspecified, third trimester: Secondary | ICD-10-CM | POA: Diagnosis present

## 2016-11-08 DIAGNOSIS — D649 Anemia, unspecified: Secondary | ICD-10-CM | POA: Diagnosis present

## 2016-11-08 DIAGNOSIS — G43909 Migraine, unspecified, not intractable, without status migrainosus: Secondary | ICD-10-CM | POA: Diagnosis present

## 2016-11-08 DIAGNOSIS — O9902 Anemia complicating childbirth: Principal | ICD-10-CM | POA: Diagnosis present

## 2016-11-08 LAB — CBC
HCT: 33.6 % — ABNORMAL LOW (ref 35.0–47.0)
Hemoglobin: 11.1 g/dL — ABNORMAL LOW (ref 12.0–16.0)
MCH: 25.5 pg — ABNORMAL LOW (ref 26.0–34.0)
MCHC: 33.1 g/dL (ref 32.0–36.0)
MCV: 76.8 fL — AB (ref 80.0–100.0)
PLATELETS: 222 10*3/uL (ref 150–440)
RBC: 4.38 MIL/uL (ref 3.80–5.20)
RDW: 14.8 % — ABNORMAL HIGH (ref 11.5–14.5)
WBC: 6.4 10*3/uL (ref 3.6–11.0)

## 2016-11-08 MED ORDER — AMMONIA AROMATIC IN INHA
RESPIRATORY_TRACT | Status: AC
  Filled 2016-11-08: qty 10

## 2016-11-08 MED ORDER — MISOPROSTOL 200 MCG PO TABS
ORAL_TABLET | ORAL | Status: AC
  Filled 2016-11-08: qty 4

## 2016-11-08 MED ORDER — LIDOCAINE HCL (PF) 1 % IJ SOLN
INTRAMUSCULAR | Status: AC
  Filled 2016-11-08: qty 30

## 2016-11-08 MED ORDER — OXYTOCIN 10 UNIT/ML IJ SOLN
INTRAMUSCULAR | Status: AC
  Filled 2016-11-08: qty 2

## 2016-11-08 MED ORDER — LACTATED RINGERS IV SOLN
INTRAVENOUS | Status: DC
  Administered 2016-11-08: 23:00:00 via INTRAVENOUS

## 2016-11-08 MED ORDER — FENTANYL 2.5 MCG/ML W/ROPIVACAINE 0.2% IN NS 100 ML EPIDURAL INFUSION (ARMC-ANES)
EPIDURAL | Status: AC
  Filled 2016-11-08: qty 100

## 2016-11-08 MED ORDER — OXYTOCIN 40 UNITS IN LACTATED RINGERS INFUSION - SIMPLE MED
INTRAVENOUS | Status: AC
Start: 2016-11-08 — End: 2016-11-09
  Filled 2016-11-08: qty 1000

## 2016-11-08 MED ORDER — LACTATED RINGERS IV SOLN: 500.0000 mL | INTRAVENOUS | Status: DC | PRN

## 2016-11-08 NOTE — Anesthesia Preprocedure Evaluation (Signed)
Anesthesia Evaluation  Patient identified by MRN, date of birth, ID band Patient awake    Reviewed: Allergy & Precautions, NPO status , Patient's Chart, lab work & pertinent test results  Airway Mallampati: II  TM Distance: >3 FB     Dental no notable dental hx.    Pulmonary neg pulmonary ROS,    Pulmonary exam normal        Cardiovascular negative cardio ROS Normal cardiovascular exam     Neuro/Psych  Headaches, negative psych ROS   GI/Hepatic negative GI ROS, Neg liver ROS,   Endo/Other  negative endocrine ROS  Renal/GU negative Renal ROS  negative genitourinary   Musculoskeletal negative musculoskeletal ROS (+)   Abdominal Normal abdominal exam  (+)   Peds negative pediatric ROS (+)  Hematology  (+) anemia ,   Anesthesia Other Findings   Reproductive/Obstetrics (+) Pregnancy                             Anesthesia Physical Anesthesia Plan  ASA: II  Anesthesia Plan: Epidural   Post-op Pain Management:    Induction:   Airway Management Planned: Natural Airway  Additional Equipment:   Intra-op Plan:   Post-operative Plan:   Informed Consent: I have reviewed the patients History and Physical, chart, labs and discussed the procedure including the risks, benefits and alternatives for the proposed anesthesia with the patient or authorized representative who has indicated his/her understanding and acceptance.   Dental advisory given  Plan Discussed with: CRNA and Surgeon  Anesthesia Plan Comments:         Anesthesia Quick Evaluation

## 2016-11-09 ENCOUNTER — Encounter: Payer: Self-pay | Admitting: *Deleted

## 2016-11-09 ENCOUNTER — Inpatient Hospital Stay: Payer: BLUE CROSS/BLUE SHIELD | Admitting: Anesthesiology

## 2016-11-09 LAB — OB RESULTS CONSOLE GBS: STREP GROUP B AG: NEGATIVE

## 2016-11-09 LAB — TYPE AND SCREEN
ABO/RH(D): A POS
ANTIBODY SCREEN: NEGATIVE

## 2016-11-09 LAB — OB RESULTS CONSOLE HIV ANTIBODY (ROUTINE TESTING): HIV: NONREACTIVE

## 2016-11-09 LAB — OB RESULTS CONSOLE RUBELLA ANTIBODY, IGM: Rubella: IMMUNE

## 2016-11-09 LAB — OB RESULTS CONSOLE VARICELLA ZOSTER ANTIBODY, IGG: Varicella: IMMUNE

## 2016-11-09 LAB — OB RESULTS CONSOLE HEPATITIS B SURFACE ANTIGEN: Hepatitis B Surface Ag: NEGATIVE

## 2016-11-09 LAB — OB RESULTS CONSOLE GC/CHLAMYDIA
CHLAMYDIA, DNA PROBE: NEGATIVE
GC PROBE AMP, GENITAL: NEGATIVE

## 2016-11-09 MED ORDER — IBUPROFEN 600 MG PO TABS
600.0000 mg | ORAL_TABLET | Freq: Once | ORAL | Status: AC
  Administered 2016-11-09: 600 mg via ORAL

## 2016-11-09 MED ORDER — HYDROCORTISONE 2.5 % RE CREA
TOPICAL_CREAM | RECTAL | Status: DC | PRN
  Filled 2016-11-09: qty 28.35

## 2016-11-09 MED ORDER — ONDANSETRON HCL 4 MG/2ML IJ SOLN: 4.0000 mg | INTRAMUSCULAR | Status: DC | PRN

## 2016-11-09 MED ORDER — LIDOCAINE HCL (PF) 2 % IJ SOLN
INTRAMUSCULAR | Status: DC | PRN
  Administered 2016-11-09: 3 mL via INTRADERMAL

## 2016-11-09 MED ORDER — DIPHENHYDRAMINE HCL 50 MG/ML IJ SOLN: 12.5000 mg | INTRAMUSCULAR | Status: DC | PRN

## 2016-11-09 MED ORDER — ONDANSETRON HCL 4 MG PO TABS: 4.0000 mg | ORAL_TABLET | ORAL | Status: DC | PRN

## 2016-11-09 MED ORDER — COCONUT OIL OIL
1.0000 "application " | TOPICAL_OIL | Status: DC | PRN
  Administered 2016-11-09: 1 via TOPICAL
  Filled 2016-11-09: qty 120

## 2016-11-09 MED ORDER — LIDOCAINE-EPINEPHRINE (PF) 1.5 %-1:200000 IJ SOLN
INTRAMUSCULAR | Status: DC | PRN
  Administered 2016-11-09: 3 mL via PERINEURAL

## 2016-11-09 MED ORDER — BENZOCAINE-MENTHOL 20-0.5 % EX AERO: 1.0000 "application " | INHALATION_SPRAY | CUTANEOUS | Status: DC | PRN

## 2016-11-09 MED ORDER — LIDOCAINE HCL (PF) 1 % IJ SOLN
INTRAMUSCULAR | Status: DC | PRN
  Administered 2016-11-09: 3 mL

## 2016-11-09 MED ORDER — ACETAMINOPHEN 325 MG PO TABS
650.0000 mg | ORAL_TABLET | Freq: Four times a day (QID) | ORAL | Status: DC | PRN
  Administered 2016-11-09: 650 mg via ORAL

## 2016-11-09 MED ORDER — ACETAMINOPHEN 325 MG PO TABS
ORAL_TABLET | ORAL | Status: AC
  Filled 2016-11-09: qty 2

## 2016-11-09 MED ORDER — DIPHENHYDRAMINE HCL 25 MG PO CAPS
25.0000 mg | ORAL_CAPSULE | Freq: Four times a day (QID) | ORAL | Status: DC | PRN
Start: 2016-11-09 — End: 2016-11-10

## 2016-11-09 MED ORDER — TETANUS-DIPHTH-ACELL PERTUSSIS 5-2.5-18.5 LF-MCG/0.5 IM SUSP: 0.5000 mL | Freq: Once | INTRAMUSCULAR | Status: DC

## 2016-11-09 MED ORDER — IBUPROFEN 600 MG PO TABS
600.0000 mg | ORAL_TABLET | Freq: Four times a day (QID) | ORAL | Status: DC
  Administered 2016-11-09 – 2016-11-10 (×4): 600 mg via ORAL
  Filled 2016-11-09 (×4): qty 1

## 2016-11-09 MED ORDER — SIMETHICONE 80 MG PO CHEW: 80.0000 mg | CHEWABLE_TABLET | ORAL | Status: DC | PRN

## 2016-11-09 MED ORDER — LACTATED RINGERS IV SOLN: 500.0000 mL | Freq: Once | INTRAVENOUS | Status: DC

## 2016-11-09 MED ORDER — ACETAMINOPHEN 500 MG PO TABS
1000.0000 mg | ORAL_TABLET | Freq: Four times a day (QID) | ORAL | Status: DC | PRN
  Administered 2016-11-09: 1000 mg via ORAL
  Filled 2016-11-09: qty 2

## 2016-11-09 MED ORDER — IBUPROFEN 600 MG PO TABS
ORAL_TABLET | ORAL | Status: AC
  Administered 2016-11-09: 600 mg via ORAL
  Filled 2016-11-09: qty 1

## 2016-11-09 MED ORDER — DOCUSATE SODIUM 100 MG PO CAPS
100.0000 mg | ORAL_CAPSULE | Freq: Two times a day (BID) | ORAL | Status: DC
  Administered 2016-11-09 – 2016-11-10 (×2): 100 mg via ORAL
  Filled 2016-11-09 (×2): qty 1

## 2016-11-09 MED ORDER — EPHEDRINE 5 MG/ML INJ
10.0000 mg | INTRAVENOUS | Status: DC | PRN
  Filled 2016-11-09: qty 2

## 2016-11-09 MED ORDER — BUPIVACAINE HCL (PF) 0.25 % IJ SOLN
INTRAMUSCULAR | Status: DC | PRN
  Administered 2016-11-09: 10 mL via EPIDURAL

## 2016-11-09 MED ORDER — WITCH HAZEL-GLYCERIN EX PADS: 1.0000 "application " | MEDICATED_PAD | CUTANEOUS | Status: DC | PRN

## 2016-11-09 MED ORDER — PHENYLEPHRINE 40 MCG/ML (10ML) SYRINGE FOR IV PUSH (FOR BLOOD PRESSURE SUPPORT)
80.0000 ug | PREFILLED_SYRINGE | INTRAVENOUS | Status: DC | PRN
  Filled 2016-11-09: qty 5

## 2016-11-09 MED ORDER — FENTANYL 2.5 MCG/ML W/ROPIVACAINE 0.2% IN NS 100 ML EPIDURAL INFUSION (ARMC-ANES)
9.0000 mL/h | EPIDURAL | Status: DC
  Administered 2016-11-09: 9 mL/h via EPIDURAL

## 2016-11-09 MED ORDER — ONDANSETRON HCL 4 MG/2ML IJ SOLN
INTRAMUSCULAR | Status: AC
  Administered 2016-11-09: 4 mg
  Filled 2016-11-09: qty 2

## 2016-11-09 MED ORDER — OXYTOCIN 40 UNITS IN LACTATED RINGERS INFUSION - SIMPLE MED
2.5000 [IU]/h | INTRAVENOUS | Status: DC
  Administered 2016-11-09: 39.96 [IU]/h via INTRAVENOUS

## 2016-11-09 MED ORDER — ONDANSETRON HCL 4 MG/2ML IJ SOLN: 4.0000 mg | Freq: Four times a day (QID) | INTRAMUSCULAR | Status: DC

## 2016-11-09 MED ORDER — PRENATAL MULTIVITAMIN CH
1.0000 | ORAL_TABLET | Freq: Every day | ORAL | Status: DC
  Administered 2016-11-09: 1 via ORAL
  Filled 2016-11-09 (×2): qty 1

## 2016-11-09 NOTE — Progress Notes (Signed)
Patient continues to deny need for interpreter. She states that she understands all that was explained to her

## 2016-11-09 NOTE — Discharge Summary (Signed)
Obstetrical Discharge Summary  Patient Name: Laura Mcguire DOB: 1981/02/15 MRN: [id number]  Date of Admission: 11/08/2016 Date of Delivery: 11/09/16 Delivered by: Ranae Plumber, MD Date of Discharge: 11/10/2016  Primary OB:  [facility]   VHQ:IONGEXB'M last menstrual period was 02/04/2016. EDC Estimated Date of Delivery: 11/20/16 Gestational Age at Delivery: [redacted]w[redacted]d   Antepartum complications: 1. History of depression 2. Has child with developmental delay 3. 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 Late to care @ 14wks 5. Tobacco and ETOH use in early pregnancy 6. Hx of abnormal pap  7. Migraines 8. PCN allergy  Admitting Diagnosis: SROM, labor  Secondary Diagnosis: Patient Active Problem List   Diagnosis Date Noted  . Pregnancy 11/08/2016  . Vaginal bleeding in pregnancy, third trimester 10/13/2016  . Vaginal bleeding in pregnancy, second trimester 07/02/2016  . Family history of autism   . Right hemiparesis (HCC) 10/10/2015  . Headache 10/09/2015    Augmentation: none Complications: None Intrapartum complications/course:  Date of Delivery:  Delivered By: Ranae Plumber Delivery Type: spontaneous vaginal delivery Anesthesia: epidural Placenta: sponatneous Laceration: none Episiotomy: none Newborn Data: Live born female  Birth Weight: 6 lb 7.4 oz (2930 g) APGAR: 9, 9    Postpartum Procedures: none  Post partum course:  Patient had an uncomplicated postpartum course.  By time of discharge on PPD#1, her pain was controlled on oral pain medications; she had appropriate lochia and was ambulating, voiding without difficulty and tolerating regular diet.  She was deemed stable for discharge to home.     Discharge Physical Exam:  BP 106/71 (BP Location: Right Arm)   Pulse 73   Temp 98.3 F (36.8 C) (Oral)   Resp 18   Ht 5\' 3"  (1.6 m)   Wt 172 lb (78 kg)   LMP 02/04/2016   SpO2 96%   BMI 30.47 kg/m   General: NAD CV: RRR Pulm: CTABL, nl effort ABD: s/nd/nt, fundus firm  and below the umbilicus Lochia: moderate DVT Evaluation: LE non-ttp, no evidence of DVT on exam.  Hemoglobin  Date Value Ref Range Status  11/10/2016 9.3 (L) 12.0 - 16.0 g/dL Final   HGB  Date Value Ref Range Status  11/18/2013 14.9 12.0 - 16.0 g/dL Final   HCT  Date Value Ref Range Status  11/10/2016 28.9 (L) 35.0 - 47.0 % Final  11/18/2013 43.5 35.0 - 47.0 % Final     Disposition: stable, discharge to home. Baby Feeding: breastmilk Baby Disposition: home with mom  Rh Immune globulin given: n/a Rubella vaccine given: n/a Tdap vaccine given in AP or PP setting: AP Flu vaccine given in AP or PP setting: declined  Contraception: Mirena  Prenatal Labs:  Blood type/Rh A+  Antibody screen neg  Rubella Immune  Varicella Immune  RPR NR  HBsAg Neg  HIV NR  GC neg  Chlamydia neg  Genetic screening Not done  1 hour GTT 114  3 hour GTT   GBS Negative per [facility] phone report      Plan:  Loa Socks Tymeka Privette was discharged to home in good condition. Follow-up appointment at [facility] in 4 weeks   Discharge Medications: Allergies as of 11/10/2016      Reactions   Ampicillin Hives, Swelling   Penicillins Hives, Swelling   Has patient had a PCN reaction causing immediate rash, facial/tongue/throat swelling, SOB or lightheadedness with hypotension: yes Has patient had a PCN reaction causing severe rash involving mucus membranes or skin necrosis: {no Has patient had a PCN reaction  that required hospitalization no Has patient had a PCN reaction occurring within the last 10 years: no If all of the above answers are "NO", then may proceed with Cephalosporin use.   Penicillins Hives, Swelling   Has patient had a PCN reaction causing immediate rash, facial/tongue/throat swelling, SOB or lightheadedness with hypotension: Yes Has patient had a PCN reaction causing severe rash involving mucus membranes or skin necrosis: No Has patient had a PCN reaction that required  hospitalization: No Has patient had a PCN reaction occurring within the last 10 years: No If all of the above answers are "NO", then may proceed with Cephalosporin use.      Medication List    TAKE these medications   ascorbic acid 250 MG tablet Commonly known as:  VITAMIN C Take 1 tablet (250 mg total) by mouth 2 (two) times daily with a meal. Take with iron for anemia   docusate sodium 100 MG capsule Commonly known as:  COLACE Take 1 capsule (100 mg total) by mouth daily as needed for mild constipation.   ferrous sulfate 325 (65 FE) MG tablet Take 325 mg by mouth daily with breakfast. What changed:  Another medication with the same name was added. Make sure you understand how and when to take each.   ferrous sulfate 325 (65 FE) MG tablet Take 1 tablet (325 mg total) by mouth 2 (two) times daily with a meal. For anemia, take with Vitamin C What changed:  You were already taking a medication with the same name, and this prescription was added. Make sure you understand how and when to take each.   ibuprofen 800 MG tablet Commonly known as:  ADVIL,MOTRIN Take 1 tablet (800 mg total) by mouth every 8 (eight) hours as needed for moderate pain or cramping.   multivitamin-prenatal 27-0.8 MG Tabs tablet Take 1 tablet by mouth daily at 12 noon.       Follow-up Information    Department, [facility] Follow [facility] in 6 week(s).   Contact information: [address] [place] [id number] [phone]           Signed: Christeen DouglasBethany Eugene Zeiders 11/10/16

## 2016-11-09 NOTE — Plan of Care (Signed)
Problem: Pain Managment: Goal: General experience of comfort will improve Outcome: Progressing [facility]  Problem: Nutritional: Goal: Mothers verbalization of comfort with breastfeeding process will improve Outcome: Progressing [facility] in to assist mom. She is a First Time Breast Feeding Mom  Problem: Pain Management: Goal: General experience of comfort will improve and pain level will decrease Outcome: Progressing [facility].

## 2016-11-09 NOTE — H&P (Signed)
OB History & Physical   History of Present Illness:  Chief Complaint:   HPI:  Laura Mcguire is a 36 y.o. [id number] female at 4061w3d dated by unsure LMP 02/19/16, US showing EDC of 11/20/2016.  She presents to L&D for LOF, clear, and contractions.  +FM, + CTX, + LOF, no VB  Pregnancy Issues: 1. History of depression 2. Has child with developmental delay 3. 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 Late to care @ 14wks 5. Tobacco and ETOH use in early pregnancy 6. Hx of abnormal pap  7. Migraines 8. PCN allergy   Maternal Medical History:   Past Medical History:  Diagnosis Date  . Anemia   . Migraines     Past Surgical History:  Procedure Laterality Date  . GALLBLADDER SURGERY      Allergies  Allergen Reactions  . Ampicillin Hives and Swelling  . Penicillins Hives and Swelling    Has patient had a PCN reaction causing immediate rash, facial/tongue/throat swelling, SOB or lightheadedness with hypotension: yes Has patient had a PCN reaction causing severe rash involving mucus membranes or skin necrosis: {no Has patient had a PCN reaction that required hospitalization no Has patient had a PCN reaction occurring within the last 10 years: no If all of the above answers are "NO", then may proceed with Cephalosporin use.   Marland Kitchen. Penicillins Hives and Swelling    Has patient had a PCN reaction causing immediate rash, facial/tongue/throat swelling, SOB or lightheadedness with hypotension: Yes Has patient had a PCN reaction causing severe rash involving mucus membranes or skin necrosis: No Has patient had a PCN reaction that required hospitalization: No Has patient had a PCN reaction occurring within the last 10 years: No If all of the above answers are "NO", then may proceed with Cephalosporin use.     Prior to Admission medications   Medication Sig Start Date End Date Taking? Authorizing Provider  ferrous sulfate 325 (65 FE) MG tablet Take 325 mg by mouth daily with breakfast.    Yes [provider]  Prenatal Vit-Fe Fumarate-FA (MULTIVITAMIN-PRENATAL) 27-0.8 MG TABS tablet Take 1 tablet by mouth daily at 12 noon.   Yes [provider]     [facility]   Social History: She  reports that she has never smoked. She has never used smokeless tobacco. She reports that she does not drink alcohol or use drugs.  Family History: family history is not on file.   Review of Systems: A full review of systems was performed and negative except as noted in the HPI.     Physical Exam:  Vital Signs: BP (!) 129/55   Pulse 61   Temp 97.9 F (36.6 C) (Oral)   Resp 18   Ht 5\' 3"  (1.6 m)   Wt 78 kg (172 lb)   LMP 02/04/2016   BMI 30.47 kg/m  General: no acute distress.  HEENT: normocephalic, atraumatic Heart: regular rate & rhythm.  No murmurs/rubs/gallops Lungs: clear to auscultation bilaterally, normal respiratory effort Abdomen: soft, gravid, non-tender;  EFW: 6.10 Pelvic:   External: Normal external female genitalia  Cervix: Dilation: 4 / Effacement (%): 70 / Station: -2    Extremities: non-tender, symmetric, minimal edema bilaterally.  DTRs: 2+ Neurologic: Alert & oriented x 3.    Results for orders placed or performed during the hospital encounter of 11/08/16 (from the past 24 hour(s))  CBC     Status: Abnormal   Collection Time: 11/08/16 11:19 PM  Result Value Ref Range   WBC 6.4 3.6 - 11.0 K/uL   RBC 4.38 3.80 - 5.20 MIL/uL   Hemoglobin 11.1 (L) 12.0 - 16.0 g/dL   HCT 40.9 (L) 81.1 - 91.4 %   MCV 76.8 (L) 80.0 - 100.0 fL   MCH 25.5 (L) 26.0 - 34.0 pg   MCHC 33.1 32.0 - 36.0 g/dL   RDW 78.2 (H) 95.6 - 21.3 %   Platelets 222 150 - 440 K/uL  Type and screen [facility]     Status: None   Collection Time: 11/08/16 11:19 PM  Result Value Ref Range   ABO/RH(D) A POS    Antibody Screen NEG    Sample Expiration 11/11/2016     Pertinent Results:  Prenatal Labs: Blood type/Rh A+   Antibody screen neg  Rubella Immune  Varicella Immune  RPR NR  HBsAg Neg  HIV NR  GC neg  Chlamydia neg  Genetic screening Not done  1 hour GTT 114  3 hour GTT   GBS Negative per [facility] phone report   FHT: 145 mod + accels no decels TOCO: q2-64min SVE:  Dilation: 4 / Effacement (%): 70 / Station: -2    Cephalic by leopolds   Assessment:  Laura Mcguire is a 36 y.o. G63P0003 female at [redacted]w[redacted]d with SROM.   Plan:  1. Admit to Labor & Delivery 2. CBC, T&S, Clrs, IVF 3. GBS  Negative per [employer] telephone report. 4. Consents obtained. 5. Continuous efm/toco 6. Expectant managemnent 7. Epidural if requested  ----- Ranae Plumber, MD Attending Obstetrician and Gynecologist [facility], Department of OB/GYN [facility]

## 2016-11-09 NOTE — Anesthesia Procedure Notes (Signed)
Epidural Patient location during procedure: OB Start time: 11/09/2016 12:08 AM End time: 11/09/2016 12:20 AM  Staffing Anesthesiologist: Yves DillARROLL, Barrie Sigmund Performed: anesthesiologist   Preanesthetic Checklist Completed: patient identified, site marked, surgical consent, pre-op evaluation, timeout performed, IV checked, risks and benefits discussed and monitors and equipment checked  Epidural Patient position: sitting Prep: Betadine and site prepped and draped Patient monitoring: heart rate, continuous pulse ox, blood pressure and cardiac monitor Approach: midline Location: L3-L4 Injection technique: LOR air  Needle:  Needle type: Tuohy  Needle gauge: 18 G Needle length: 9 cm and 9 Catheter type: closed end Catheter size: 20 Guage Test dose: negative and 1.5% lidocaine with Epi 1:200 K  Assessment Events: blood not aspirated, injection not painful, no injection resistance, negative IV test and no paresthesia  Additional Notes Time out called.  Patient placed in sitting position.  Back prepped and draped in sterile fashion.  A skin wheal was made in the L3-L4 interspace with 1% Lidocaine plain.  An 18G Tuohy needle was guided into the epidural space by a loss of resistance technique.  The epidural catheter was threaded 3 cm into the epidural space and the test dose was negative.  No blood or paresthesias.  The patient was somewhat mobile initially , but settled down.  The patient tolerated the procedure well.  The catheter was affixed to the back in sterile fashion.Reason for block:procedure for pain

## 2016-11-09 NOTE — Progress Notes (Signed)
Sharin Graveooper, Laura Crymes R, RN Registered Nurse Signed   Progress Notes Date of Service: 11/09/2016 11:38 AM      [] Hide copied text [] Hover for attribution information Upon admission RN asked patient about need for interpreter and 3 times she has denied. Father of baby speaks very good [place]. Patient speaks less English than he does but continues to deny need for  interpreter at this time and RN had explained to patient that we can not use family member as interpreter and she voices understanding and states that so far she doesn't need one and that she understands everything so far. RN will continue to monitor and will bring in interpreter just in case when giving meds due at 1500. Currently patient denies pain and denies need for meds.

## 2016-11-10 ENCOUNTER — Encounter: Payer: Self-pay | Admitting: Lactation Services

## 2016-11-10 LAB — CBC
HEMATOCRIT: 28.9 % — AB (ref 35.0–47.0)
HEMOGLOBIN: 9.3 g/dL — AB (ref 12.0–16.0)
MCH: 24.9 pg — ABNORMAL LOW (ref 26.0–34.0)
MCHC: 32.1 g/dL (ref 32.0–36.0)
MCV: 77.7 fL — AB (ref 80.0–100.0)
Platelets: 195 10*3/uL (ref 150–440)
RBC: 3.72 MIL/uL — ABNORMAL LOW (ref 3.80–5.20)
RDW: 15.1 % — AB (ref 11.5–14.5)
WBC: 9.2 10*3/uL (ref 3.6–11.0)

## 2016-11-10 LAB — RPR: RPR Ser Ql: NONREACTIVE

## 2016-11-10 MED ORDER — ASCORBIC ACID 250 MG PO TABS: 250.0000 mg | ORAL_TABLET | Freq: Two times a day (BID) | ORAL | 3 refills | Status: AC

## 2016-11-10 MED ORDER — FERROUS SULFATE 325 (65 FE) MG PO TABS: 325.0000 mg | ORAL_TABLET | Freq: Two times a day (BID) | ORAL | 3 refills | Status: DC

## 2016-11-10 MED ORDER — IBUPROFEN 800 MG PO TABS: 800.0000 mg | ORAL_TABLET | Freq: Three times a day (TID) | ORAL | 0 refills | Status: AC | PRN

## 2016-11-10 MED ORDER — DOCUSATE SODIUM 100 MG PO CAPS: 100.0000 mg | ORAL_CAPSULE | Freq: Every day | ORAL | 3 refills | Status: DC | PRN

## 2016-11-10 NOTE — Discharge Instructions (Signed)
Follow up sooner with fever, problems breathing, pain not helped by medications, severe depression( more than just baby blues, wanting to hurt yourself or the baby), severe bleeding ( saturating more than one pad an hour or large palm sized clots), no heavy lifting , no driving while taking narcotics, no douches, intercourse, tampons or enemas for 6 weeks Discharge instructions:   Call office if you have any of the following: headache, visual changes, fever >101.0 F, chills, breast concerns, excessive vaginal bleeding, incision drainage or problems, leg pain or redness, depression or any other concerns.   Activity: Do not lift > 10 lbs for 6 weeks.  No intercourse or tampons for 6 weeks.  No driving for 1-2 weeks.   Call your doctor for increased pain or vaginal bleeding, temperature above 101.0, depression, or concerns.  No strenuous activity or heavy lifting for 6 weeks.  No intercourse, tampons, douching, or enemas for 6 weeks.  No tub baths-showers only.  No driving for 2 weeks or while taking pain medications.  Continue prenatal vitamin and iron.  Increase calories and fluids while breastfeeding.  You may have a slight fever when your milk comes in, but it should go away on its own.  If it does not, and rises above 101.0 please call the doctor.  For concerns about your baby, please call your pediatrician For breastfeeding concerns, the lactation consultant can be reached at [phone]

## 2016-11-10 NOTE — Progress Notes (Signed)
Patient denied need for interpreter during discharge and RN had her repeat teaching back to RN

## 2016-11-10 NOTE — Progress Notes (Signed)
All discharge instructions given to patient and she voices understanding of all instructions given.   She will make her own f/u appt. Patient discharged home with infant in arms escorted out by volunteer

## 2016-11-10 NOTE — Anesthesia Postprocedure Evaluation (Signed)
Anesthesia Post Note  Patient: Laura Mcguire  Procedure(s) Performed: * No procedures listed *  Patient location during evaluation: Mother Baby Anesthesia Type: Epidural Level of consciousness: awake and alert and oriented Pain management: satisfactory to patient Vital Signs Assessment: post-procedure vital signs reviewed and stable Respiratory status: respiratory function stable Cardiovascular status: stable Postop Assessment: no headache, no backache, epidural receding, patient able to bend at knees, adequate PO intake and no signs of nausea or vomiting Anesthetic complications: no     Last Vitals:  Vitals:   11/10/16 0000 11/10/16 0315  BP: (!) 106/59 (!) 99/56  Pulse: 60 (!) 57  Resp: 18 18  Temp: 36.8 C 36.5 C    Last Pain:  Vitals:   11/10/16 0415  TempSrc:   PainSc: Thomasene RippleAsleep                 Lucian Baswell D

## 2017-04-23 ENCOUNTER — Emergency Department
Admission: EM | Admit: 2017-04-23 | Discharge: 2017-04-23 | Disposition: A | Discharge status: Home / Self Care | Payer: Managed Care, Other (non HMO) | Attending: Emergency Medicine | Admitting: Emergency Medicine

## 2017-04-23 ENCOUNTER — Emergency Department: Payer: Managed Care, Other (non HMO)

## 2017-04-23 ENCOUNTER — Encounter: Payer: Self-pay | Admitting: Emergency Medicine

## 2017-04-23 DIAGNOSIS — Z79899 Other long term (current) drug therapy: Secondary | ICD-10-CM | POA: Insufficient documentation

## 2017-04-23 DIAGNOSIS — N200 Calculus of kidney: Secondary | ICD-10-CM

## 2017-04-23 DIAGNOSIS — R1031 Right lower quadrant pain: Secondary | ICD-10-CM | POA: Diagnosis present

## 2017-04-23 LAB — URINALYSIS, COMPLETE (UACMP) WITH MICROSCOPIC
BILIRUBIN URINE: NEGATIVE
Bacteria, UA: NONE SEEN
GLUCOSE, UA: NEGATIVE mg/dL
Ketones, ur: 5 mg/dL — AB
LEUKOCYTES UA: NEGATIVE
Nitrite: NEGATIVE
PH: 6 (ref 5.0–8.0)
PROTEIN: 100 mg/dL — AB
Specific Gravity, Urine: 1.029 (ref 1.005–1.030)

## 2017-04-23 LAB — COMPREHENSIVE METABOLIC PANEL
ALT: 41 U/L (ref 14–54)
ANION GAP: 12 (ref 5–15)
AST: 33 U/L (ref 15–41)
Albumin: 4.3 g/dL (ref 3.5–5.0)
Alkaline Phosphatase: 74 U/L (ref 38–126)
BUN: 11 mg/dL (ref 6–20)
CHLORIDE: 100 mmol/L — AB (ref 101–111)
CO2: 27 mmol/L (ref 22–32)
Calcium: 9.3 mg/dL (ref 8.9–10.3)
Creatinine, Ser: 0.56 mg/dL (ref 0.44–1.00)
Glucose, Bld: 110 mg/dL — ABNORMAL HIGH (ref 65–99)
POTASSIUM: 3.5 mmol/L (ref 3.5–5.1)
SODIUM: 139 mmol/L (ref 135–145)
Total Bilirubin: 0.7 mg/dL (ref 0.3–1.2)
Total Protein: 8.4 g/dL — ABNORMAL HIGH (ref 6.5–8.1)

## 2017-04-23 LAB — CBC
HCT: 42 % (ref 35.0–47.0)
HEMOGLOBIN: 14 g/dL (ref 12.0–16.0)
MCH: 27.5 pg (ref 26.0–34.0)
MCHC: 33.4 g/dL (ref 32.0–36.0)
MCV: 82.4 fL (ref 80.0–100.0)
Platelets: 327 10*3/uL (ref 150–440)
RBC: 5.09 MIL/uL (ref 3.80–5.20)
RDW: 14 % (ref 11.5–14.5)
WBC: 8.3 10*3/uL (ref 3.6–11.0)

## 2017-04-23 LAB — HCG, QUANTITATIVE, PREGNANCY

## 2017-04-23 LAB — LIPASE, BLOOD: LIPASE: 24 U/L (ref 11–51)

## 2017-04-23 MED ORDER — ONDANSETRON 4 MG PO TBDP: 4.0000 mg | ORAL_TABLET | Freq: Three times a day (TID) | ORAL | 0 refills | Status: DC | PRN

## 2017-04-23 MED ORDER — SODIUM CHLORIDE 0.9 % IV BOLUS (SEPSIS)
1000.0000 mL | Freq: Once | INTRAVENOUS | Status: AC
  Administered 2017-04-23: 1000 mL via INTRAVENOUS

## 2017-04-23 MED ORDER — ONDANSETRON HCL 4 MG/2ML IJ SOLN
4.0000 mg | Freq: Once | INTRAMUSCULAR | Status: AC
  Administered 2017-04-23: 4 mg via INTRAVENOUS
  Filled 2017-04-23: qty 2

## 2017-04-23 MED ORDER — KETOROLAC TROMETHAMINE 30 MG/ML IJ SOLN
15.0000 mg | Freq: Once | INTRAMUSCULAR | Status: AC
  Administered 2017-04-23: 15 mg via INTRAVENOUS
  Filled 2017-04-23: qty 1

## 2017-04-23 MED ORDER — IBUPROFEN 600 MG PO TABS: 600.0000 mg | ORAL_TABLET | Freq: Four times a day (QID) | ORAL | 0 refills | Status: DC | PRN

## 2017-04-23 MED ORDER — IOPAMIDOL (ISOVUE-300) INJECTION 61%
100.0000 mL | Freq: Once | INTRAVENOUS | Status: AC | PRN
  Administered 2017-04-23: 100 mL via INTRAVENOUS
  Filled 2017-04-23: qty 100

## 2017-04-23 MED ORDER — OXYCODONE-ACETAMINOPHEN 5-325 MG PO TABS: 1.0000 | ORAL_TABLET | Freq: Four times a day (QID) | ORAL | 0 refills | Status: AC | PRN

## 2017-04-23 NOTE — ED Triage Notes (Signed)
Pt arrives to the ER with complaints of abd pain and vomiting. She reports that it is yellow and sour. This all started yesterday. Also having diarrhea.

## 2017-04-23 NOTE — ED Notes (Signed)
First Nurse Note:  [facility].  Patient is complaining of RLQ pain with tenderness.

## 2017-04-23 NOTE — ED Provider Notes (Signed)
[facility] Emergency Department Provider Note  ____________________________________________  Time seen: Approximately 5:39 PM  I have reviewed the triage vital signs and the nursing notes.   HISTORY  Chief Complaint Abdominal Pain   HPI Laura Mcguire is a 36 y.o. female with a history of anemia and migraine headaches who presents for evaluation of abdominal pain.patient reports her pain started in her right flank yesterday and this morning her right abdominal area started hurting as well. She describes the pain as pressure, currently 7 out of 10, constant, nonradiating. She has had nausea and several episodes of nonbloody nonbilious emesis. She is also complaining of watery diarrhea. No melena, no hematemesis, no coffee-ground emesis. No fever or chills. She is currently on her menstrual period. She has Mirena IUD. She has no dysuria. Unable to evaluate for hematuria due to menstrual period. No chest pain or shortness of breath. Patient status post cholecystectomy many years ago. No other abdominal surgeries.  Past Medical History:  Diagnosis Date  . Anemia   . Migraines     Patient Active Problem List   Diagnosis Date Noted  . Pregnancy 11/08/2016  . Vaginal bleeding in pregnancy, third trimester 10/13/2016  . Vaginal bleeding in pregnancy, second trimester 07/02/2016  . Family history of autism   . Right hemiparesis (HCC) 10/10/2015  . Headache 10/09/2015    Past Surgical History:  Procedure Laterality Date  . GALLBLADDER SURGERY      Prior to Admission medications   Medication Sig Start Date End Date Taking? Authorizing Provider  docusate sodium (COLACE) 100 MG capsule Take 1 capsule (100 mg total) by mouth daily as needed for mild constipation. 11/10/16   Christeen DouglasBeasley, Bethany, MD  ferrous sulfate 325 (65 FE) MG tablet Take 325 mg by mouth daily with breakfast.    [provider]  ferrous sulfate 325 (65 FE) MG tablet Take 1  tablet (325 mg total) by mouth 2 (two) times daily with a meal. For anemia, take with Vitamin C 11/10/16 01/09/17  Christeen DouglasBeasley, Bethany, MD  ibuprofen (ADVIL,MOTRIN) 600 MG tablet Take 1 tablet (600 mg total) by mouth every 6 (six) hours as needed. 04/23/17   Nita SickleVeronese, [place], MD  ondansetron (ZOFRAN ODT) 4 MG disintegrating tablet Take 1 tablet (4 mg total) by mouth every 8 (eight) hours as needed for nausea or vomiting. 04/23/17   Nita SickleVeronese, [place], MD  oxyCODONE-acetaminophen (ROXICET) 5-325 MG tablet Take 1 tablet by mouth every 6 (six) hours as needed. 04/23/17 04/23/18  Nita SickleVeronese, [place], MD  Prenatal Vit-Fe Fumarate-FA (MULTIVITAMIN-PRENATAL) 27-0.8 MG TABS tablet Take 1 tablet by mouth daily at 12 noon.    [provider]    Allergies Ampicillin; Penicillins; and Penicillins  Family History  Problem Relation Age of Onset  . Diabetes Unknown   . Stroke Unknown     Social History Social History  Substance Use Topics  . Smoking status: Never Smoker  . Smokeless tobacco: Never Used  . Alcohol use No     Comment: Socially     Review of Systems  Constitutional: Negative for fever. Eyes: Negative for visual changes. ENT: Negative for sore throat. Neck: No neck pain  Cardiovascular: Negative for chest pain. Respiratory: Negative for shortness of breath. Gastrointestinal: + R sided abdominal pain, nausea, vomiting and diarrhea. Genitourinary: Negative for dysuria. + Flank pain Musculoskeletal: Negative for back pain. Skin: Negative for rash. Neurological: Negative for headaches, weakness or numbness. Psych: No SI or HI  ____________________________________________   PHYSICAL  EXAM:  VITAL SIGNS: ED Triage Vitals  Enc Vitals Group     BP 04/23/17 1554 119/65     Pulse Rate 04/23/17 1554 75     Resp 04/23/17 1554 20     Temp 04/23/17 1554 98.6 F (37 C)     Temp Source 04/23/17 1554 Oral     SpO2 04/23/17 1554 99 %     Weight 04/23/17 1556 160 lb (72.6 kg)      Height 04/23/17 1556 5\' 2"  (1.575 m)     Head Circumference --      Peak Flow --      Pain Score 04/23/17 1554 8     Pain Loc --      Pain Edu? --      Excl. in GC? --     Constitutional: Alert and oriented. Well appearing and in no apparent distress. HEENT:      Head: Normocephalic and atraumatic.         Eyes: Conjunctivae are normal. Sclera is non-icteric.       Mouth/Throat: Mucous membranes are moist.       Neck: Supple with no signs of meningismus. Cardiovascular: Regular rate and rhythm. No murmurs, gallops, or rubs. 2+ symmetrical distal pulses are present in all extremities. No JVD. Respiratory: Normal respiratory effort. Lungs are clear to auscultation bilaterally. No wheezes, crackles, or rhonchi.  Gastrointestinal: Soft, ttp diffusely in the R quadrants, and non distended with positive bowel sounds. No rebound or guarding. Genitourinary: R CVA tenderness. Musculoskeletal: Nontender with normal range of motion in all extremities. No edema, cyanosis, or erythema of extremities. Neurologic: Normal speech and language. Face is symmetric. Moving all extremities. No gross focal neurologic deficits are appreciated. Skin: Skin is warm, dry and intact. No rash noted. Psychiatric: Mood and affect are normal. Speech and behavior are normal.  ____________________________________________   LABS (all labs ordered are listed, but only abnormal results are displayed)  Labs Reviewed  COMPREHENSIVE METABOLIC PANEL - Abnormal; Notable for the following:       Result Value   Chloride 100 (*)    Glucose, Bld 110 (*)    Total Protein 8.4 (*)    All other components within normal limits  URINALYSIS, COMPLETE (UACMP) WITH MICROSCOPIC - Abnormal; Notable for the following:    Color, Urine YELLOW (*)    APPearance HAZY (*)    Hgb urine dipstick LARGE (*)    Ketones, ur 5 (*)    Protein, ur 100 (*)    Squamous Epithelial / LPF 0-5 (*)    All other components within normal limits  LIPASE,  BLOOD  CBC  HCG, QUANTITATIVE, PREGNANCY   ____________________________________________  EKG  none  ____________________________________________  RADIOLOGY  CT a/p: 2 mm nonobstructing stone towards the upper pole of the right kidney. No stones are seen in the right ureter or in the bladder. Several phleboliths are evident in the right anatomic pelvis, posterior and inferior to the distal right ureter.  ____________________________________________   PROCEDURES  Procedure(s) performed: None Procedures Critical Care performed:  None ____________________________________________   INITIAL IMPRESSION / ASSESSMENT AND PLAN / ED COURSE  36 y.o. female with a history of anemia and migraine headaches who presents for evaluation of R sided abdominal pain, R flank pain, N/V/D since yesterday. patient is well-appearing, in no distress, has normal vital signs, her abdomen is tender to palpation diffusely on the right quadrants with no rebound or guarding, she also has flank tenderness on the right.CBC,  CMP, lipase with no acute findings. Beta Quant is negative. Urinalysis is pending. Differential diagnoses including kidney stone versus appendicitis versus gastroenteritis versus diverticulitis versus colitis. We'll send patient for CT abdomen and pelvis. We'll give Toradol and Zofran for symptoms.    _________________________ 7:22 PM on 04/23/2017 -----------------------------------------  patient with significant hematuria and a 2 mm nonobstructing stone seen on CT scan. Her pain has fully resolved after Toradol. We'll discharge home with ibuprofen, Percocet, Zofran, and follow-up with urology. Discussed return precautions for any signs of developing urinary tract infection or pain that is not controlled at home.labs show a normal white count and normal kidney function.   As part of my medical decision making, I reviewed the following data within the electronic MEDICAL RECORD NUMBER History  obtained from family, Nursing notes reviewed and incorporated, Labs reviewed , Radiograph reviewed , Discussed with radiologist, Notes from prior ED visits and  Controlled Substance Database    Pertinent labs & imaging results that were available during my care of the patient were reviewed by me and considered in my medical decision making (see chart for details).    ____________________________________________   FINAL CLINICAL IMPRESSION(S) / ED DIAGNOSES  Final diagnoses:  Kidney stone      NEW MEDICATIONS STARTED DURING THIS VISIT:  New Prescriptions   IBUPROFEN (ADVIL,MOTRIN) 600 MG TABLET    Take 1 tablet (600 mg total) by mouth every 6 (six) hours as needed.   ONDANSETRON (ZOFRAN ODT) 4 MG DISINTEGRATING TABLET    Take 1 tablet (4 mg total) by mouth every 8 (eight) hours as needed for nausea or vomiting.   OXYCODONE-ACETAMINOPHEN (ROXICET) 5-325 MG TABLET    Take 1 tablet by mouth every 6 (six) hours as needed.     Note:  This document was prepared using Dragon voice recognition software and may include unintentional dictation errors.    Nita Sickle, MD 04/23/17 [phone]

## 2018-05-06 ENCOUNTER — Emergency Department: Payer: Managed Care, Other (non HMO)

## 2018-05-06 ENCOUNTER — Emergency Department
Admission: EM | Admit: 2018-05-06 | Discharge: 2018-05-06 | Disposition: A | Discharge status: Home / Self Care | Payer: Managed Care, Other (non HMO) | Attending: Emergency Medicine | Admitting: Emergency Medicine

## 2018-05-06 ENCOUNTER — Other Ambulatory Visit: Payer: Self-pay

## 2018-05-06 DIAGNOSIS — M79602 Pain in left arm: Secondary | ICD-10-CM | POA: Diagnosis not present

## 2018-05-06 DIAGNOSIS — K5900 Constipation, unspecified: Secondary | ICD-10-CM | POA: Insufficient documentation

## 2018-05-06 DIAGNOSIS — M79601 Pain in right arm: Secondary | ICD-10-CM | POA: Insufficient documentation

## 2018-05-06 DIAGNOSIS — R1084 Generalized abdominal pain: Secondary | ICD-10-CM | POA: Diagnosis present

## 2018-05-06 LAB — URINALYSIS, COMPLETE (UACMP) WITH MICROSCOPIC
Bilirubin Urine: NEGATIVE
GLUCOSE, UA: NEGATIVE mg/dL
Ketones, ur: NEGATIVE mg/dL
Leukocytes, UA: NEGATIVE
Nitrite: NEGATIVE
Protein, ur: 100 mg/dL — AB
SPECIFIC GRAVITY, URINE: 1.02 (ref 1.005–1.030)
pH: 6 (ref 5.0–8.0)

## 2018-05-06 LAB — COMPREHENSIVE METABOLIC PANEL
ALBUMIN: 4.3 g/dL (ref 3.5–5.0)
ALK PHOS: 64 U/L (ref 38–126)
ALT: 126 U/L — ABNORMAL HIGH (ref 0–44)
ANION GAP: 10 (ref 5–15)
AST: 103 U/L — ABNORMAL HIGH (ref 15–41)
BUN: 10 mg/dL (ref 6–20)
CO2: 27 mmol/L (ref 22–32)
Calcium: 9.1 mg/dL (ref 8.9–10.3)
Chloride: 99 mmol/L (ref 98–111)
Creatinine, Ser: 0.51 mg/dL (ref 0.44–1.00)
GFR calc non Af Amer: >60 mL/min (ref >60)
GLUCOSE: 145 mg/dL — AB (ref 70–99)
POTASSIUM: 3.6 mmol/L (ref 3.5–5.1)
SODIUM: 136 mmol/L (ref 135–145)
Total Bilirubin: 0.5 mg/dL (ref 0.3–1.2)
Total Protein: 8.4 g/dL — ABNORMAL HIGH (ref 6.5–8.1)

## 2018-05-06 LAB — CBC
HEMATOCRIT: 42.9 % (ref 36.0–46.0)
Hemoglobin: 14.8 g/dL (ref 12.0–15.0)
MCH: 30.5 pg (ref 26.0–34.0)
MCHC: 34.5 g/dL (ref 30.0–36.0)
MCV: 88.5 fL (ref 80.0–100.0)
NRBC: 0 % (ref 0.0–0.2)
PLATELETS: 186 10*3/uL (ref 150–400)
RBC: 4.85 MIL/uL (ref 3.87–5.11)
RDW: 11.7 % (ref 11.5–15.5)
WBC: 4.8 10*3/uL (ref 4.0–10.5)

## 2018-05-06 LAB — HCG, QUANTITATIVE, PREGNANCY: hCG, Beta Chain, Quant, S: <1 m[IU]/mL (ref <5)

## 2018-05-06 LAB — TROPONIN I: Troponin I: <0.03 ng/mL (ref <0.03)

## 2018-05-06 LAB — LIPASE, BLOOD: LIPASE: 54 U/L — AB (ref 11–51)

## 2018-05-06 LAB — POCT PREGNANCY, URINE: PREG TEST UR: NEGATIVE

## 2018-05-06 MED ORDER — MAGNESIUM CITRATE PO SOLN: 1.0000 | Freq: Once | ORAL | 0 refills | Status: AC

## 2018-05-06 MED ORDER — IOPAMIDOL (ISOVUE-300) INJECTION 61%
100.0000 mL | Freq: Once | INTRAVENOUS | Status: AC | PRN
  Administered 2018-05-06: 100 mL via INTRAVENOUS
  Filled 2018-05-06: qty 100

## 2018-05-06 MED ORDER — PREDNISONE 50 MG PO TABS: ORAL_TABLET | ORAL | 0 refills | Status: AC

## 2018-05-06 MED ORDER — IOHEXOL 300 MG/ML  SOLN
30.0000 mL | Freq: Once | INTRAMUSCULAR | Status: AC | PRN
  Administered 2018-05-06: 30 mL via INTRAVENOUS
  Filled 2018-05-06: qty 30

## 2018-05-06 MED ORDER — OXYCODONE-ACETAMINOPHEN 5-325 MG PO TABS
1.0000 | ORAL_TABLET | Freq: Once | ORAL | Status: AC
  Administered 2018-05-06: 1 via ORAL
  Filled 2018-05-06: qty 1

## 2018-05-06 MED ORDER — SENNA 8.6 MG PO CAPS: 1.0000 | ORAL_CAPSULE | Freq: Every day | ORAL | 0 refills | Status: AC

## 2018-05-06 MED ORDER — PREDNISONE 20 MG PO TABS
60.0000 mg | ORAL_TABLET | Freq: Once | ORAL | Status: AC
  Administered 2018-05-06: 60 mg via ORAL
  Filled 2018-05-06: qty 3

## 2018-05-06 NOTE — ED Triage Notes (Signed)
C/o bilaterally arm pain/tingling/stabbing and abd pain. States symptoms began today. C/o lower abd pain.    A&O, ambulatory. No distress noted. Spanish interpreter for triage.

## 2018-05-06 NOTE — ED Notes (Signed)
Patient to x ray  By stretcher now

## 2018-05-06 NOTE — ED Provider Notes (Signed)
[facility] Emergency Department Provider Note  ____________________________________________  Time seen: Approximately 9:04 PM  I have reviewed the triage vital signs and the nursing notes.   HISTORY  Chief Complaint Arm Pain and Abdominal Pain    HPI Laura Mcguire is a 37 y.o. female with a history of prior cholecystectomy, presents to the emergency department with diffuse abdominal pain and bilateral upper extremity radiculopathy for the past 2 days.  Patient reports that bilateral upper extremity pain and radiculopathy occurred at the same time as abdominal pain.  She denies subjective weakness.  Patient denies falls or mechanisms of trauma.  No neck pain.  Patient denies fever or chills at home.  She has had nausea and one episode of emesis.  Patient reports that abdominal pain is "all over".  She denies dysuria, hematuria, increased urinary frequency, low back pain, changes in vaginal discharge or concern for STDs.  She denies a history of pyelonephritis or nephrolithiasis. No alleviating measures have been attempted.    Past Medical History:  Diagnosis Date  . Anemia   . Migraines     Patient Active Problem List   Diagnosis Date Noted  . Pregnancy 11/08/2016  . Vaginal bleeding in pregnancy, third trimester 10/13/2016  . Vaginal bleeding in pregnancy, second trimester 07/02/2016  . Family history of autism   . Right hemiparesis (HCC) 10/10/2015  . Headache 10/09/2015    Past Surgical History:  Procedure Laterality Date  . CHOLECYSTECTOMY    . GALLBLADDER SURGERY      Prior to Admission medications   Medication Sig Start Date End Date Taking? Authorizing Provider  predniSONE (DELTASONE) 50 MG tablet Take one 50 mg tablet once daily for the next five days. 05/06/18   Pia Mau M, PA-C  Sennosides (SENNA) 8.6 MG CAPS Take 1 tablet by mouth daily for 7 days. 05/06/18 05/13/18  Orvil Feil, PA-C    Allergies Ampicillin;  Penicillins; and Penicillins  Family History  Problem Relation Age of Onset  . Diabetes Unknown   . Stroke Unknown     Social History Social History   Tobacco Use  . Smoking status: Never Smoker  . Smokeless tobacco: Never Used  Substance Use Topics  . Alcohol use: No    Alcohol/week: 0.0 standard drinks    Comment: Socially   . Drug use: No     Review of Systems  Constitutional: No fever/chills Eyes: No visual changes. No discharge ENT: No upper respiratory complaints. Cardiovascular: no chest pain. Respiratory: no cough. No SOB. Gastrointestinal: Patient has abdominal pain. Patient has nausea and one episode of emesis.  No diarrhea.  No constipation. Genitourinary: Negative for dysuria. No hematuria Musculoskeletal: Negative for musculoskeletal pain. Skin: Negative for rash, abrasions, lacerations, ecchymosis. Neurological: Negative for headaches, focal weakness or numbness.   ____________________________________________   PHYSICAL EXAM:  VITAL SIGNS: ED Triage Vitals  Enc Vitals Group     BP 05/06/18 1723 133/81     Pulse Rate 05/06/18 1723 66     Resp 05/06/18 1723 16     Temp 05/06/18 1723 98.4 F (36.9 C)     Temp Source 05/06/18 1723 Oral     SpO2 05/06/18 1723 100 %     Weight 05/06/18 1722 158 lb (71.7 kg)     Height 05/06/18 1722 5\' 2"  (1.575 m)     Head Circumference --      Peak Flow --      Pain Score 05/06/18 1722 8  Pain Loc --      Pain Edu? --      Excl. in GC? --      Constitutional: Alert and oriented. Well appearing and in no acute distress. Eyes: Conjunctivae are normal. PERRL. EOMI. Head: Atraumatic. ENT:      Ears: TMs are pearly.      Nose: No congestion/rhinnorhea.      Mouth/Throat: Mucous membranes are moist.  Neck: No midline C-spine tenderness.  No radiculopathy is elicited with range of motion testing of the neck. Hematological/Lymphatic/Immunilogical: No cervical lymphadenopathy.  Cardiovascular: Normal rate,  regular rhythm. Normal S1 and S2.  Good peripheral circulation. Respiratory: Normal respiratory effort without tachypnea or retractions. Lungs CTAB. Good air entry to the bases with no decreased or absent breath sounds. Gastrointestinal: Prior cholecystectomy incision sites visualized.  Lateral striae also visualized.  Patient has diffuse tenderness to palpation of the abdomen without guarding or rigidity. Bowel sounds positive in all four quadrants.  Musculoskeletal: Full range of motion to all extremities. No gross deformities appreciated. Neurologic:  Normal speech and language. No gross focal neurologic deficits are appreciated.  Skin:  Skin is warm, dry and intact. No rash noted. Psychiatric: Mood and affect are normal. Speech and behavior are normal. Patient exhibits appropriate insight and judgement.   ____________________________________________   LABS (all labs ordered are listed, but only abnormal results are displayed)  Labs Reviewed  LIPASE, BLOOD - Abnormal; Notable for the following components:      Result Value   Lipase 54 (*)    All other components within normal limits  COMPREHENSIVE METABOLIC PANEL - Abnormal; Notable for the following components:   Glucose, Bld 145 (*)    Total Protein 8.4 (*)    AST 103 (*)    ALT 126 (*)    All other components within normal limits  URINALYSIS, COMPLETE (UACMP) WITH MICROSCOPIC - Abnormal; Notable for the following components:   Color, Urine YELLOW (*)    APPearance HAZY (*)    Hgb urine dipstick SMALL (*)    Protein, ur 100 (*)    Bacteria, UA RARE (*)    All other components within normal limits  CBC  TROPONIN I  HCG, QUANTITATIVE, PREGNANCY  POC URINE PREG, ED  POCT PREGNANCY, URINE   ___________________________________  EKG   ___________________________________  RADIOLOGY I personally viewed and evaluated these images as part of my medical decision making, as well as reviewing the written report by the  radiologist.  CT Abdomen: Increased stool retention within colon.   No results found.  ____________________________________________    PROCEDURES  Procedure(s) performed:    Procedures    Medications  iohexol (OMNIPAQUE) 300 MG/ML solution 30 mL (30 mLs Intravenous Contrast Given 05/06/18 2119)  oxyCODONE-acetaminophen (PERCOCET/ROXICET) 5-325 MG per tablet 1 tablet (1 tablet Oral Given 05/06/18 2136)  iopamidol (ISOVUE-300) 61 % injection 100 mL (100 mLs Intravenous Contrast Given 05/06/18 2238)  predniSONE (DELTASONE) tablet 60 mg (60 mg Oral Given 05/06/18 2342)     ____________________________________________   INITIAL IMPRESSION / ASSESSMENT AND PLAN / ED COURSE  Pertinent labs & imaging results that were available during my care of the patient were reviewed by me and considered in my medical decision making (see chart for details).  Review of the [place] CSRS was performed in accordance of the [facility] prior to dispensing any controlled drugs.      ----------------------------------------- 9:12 PM on 05/06/2018 -----------------------------------------  Discussed work-up with Dr. Marisa Severin, attending, and he agrees with plan  of care.   Assessment and Plan:  Constipation:  Differential diagnosis includes paresthesias, diverticulitis and constipation. Patient presents to department with diffuse abdominal discomfort for the past two days and paresthesias of the bilateral upper extremities.  No leukocytosis identified on CBC. I discussed work-up with attending, Dr. Marisa Severin who agreed with CT abdomen and CT cervical spine.  Stool retention was identified on CT abdomen but no other acute abnormality was visualized.  Patient was discharged with magnesium citrate and Senna. Patient was also discharged with prednisone for numbness and tingling in the bilateral upper extremities. All patient questions were answered prior to discharge.        ____________________________________________  FINAL CLINICAL IMPRESSION(S) / ED DIAGNOSES  Final diagnoses:  Generalized abdominal pain  Constipation, unspecified constipation type      NEW MEDICATIONS STARTED DURING THIS VISIT:  ED Discharge Orders         Ordered    magnesium citrate SOLN   Once     05/06/18 2328    Sennosides (SENNA) 8.6 MG CAPS  Daily     05/06/18 2328    predniSONE (DELTASONE) 50 MG tablet     05/06/18 2330              This chart was dictated using voice recognition software/Dragon. Despite best efforts to proofread, errors can occur which can change the meaning. Any change was purely unintentional.    Orvil Feil, PA-C 05/11/18 1522    Dionne Bucy, MD 05/13/18 1505

## 2018-05-06 NOTE — ED Notes (Signed)
Pt crying in triage saying she was seen at [facility] recently and was told nothing was wrong, probably virus. States she is upset because she wants to know what's wrong and why symptoms are getting worse.

## 2019-05-11 IMAGING — CT CT ABD-PELV W/ CM
2 of 4 series · 15 of 46 positions shown, 17 images · IV contrast (APPLIED)
Comparison: 04/23/2017

CLINICAL DATA: Lower abdominal pain starting today.

EXAM:
CT ABDOMEN AND PELVIS WITH CONTRAST
TECHNIQUE: Multidetector CT imaging of the abdomen and pelvis was performed
using the standard protocol following bolus administration of
intravenous contrast.
CONTRAST:  100mL [id number] IOPAMIDOL ([id number]) INJECTION 61%

[Series 2: axial st · axial · 0.63mm/px · z∈[-907,-442]mm · 12 of 103 slices shown, 14 images]
[im 5/103  soft-tissue]
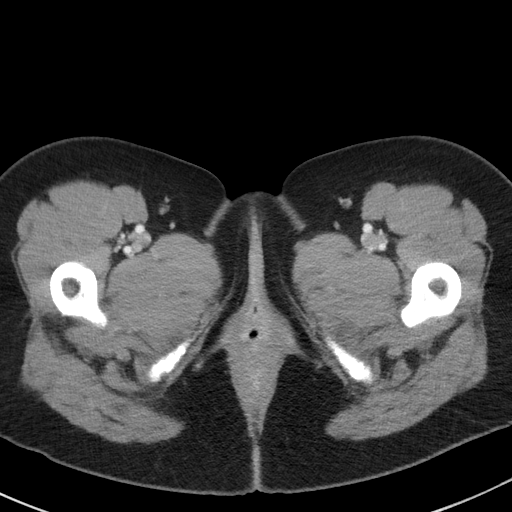
[im 5/103  bone]
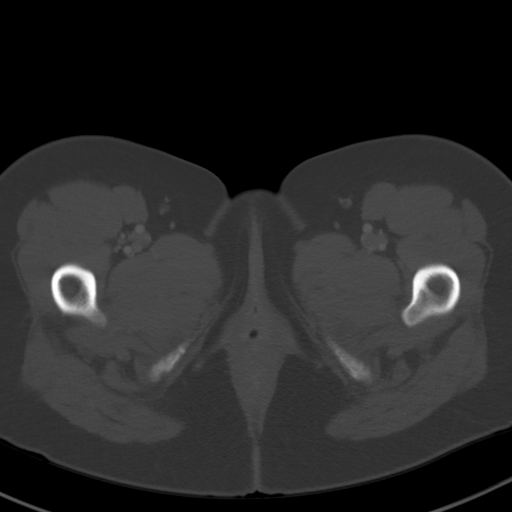
[im 13/103  soft-tissue]
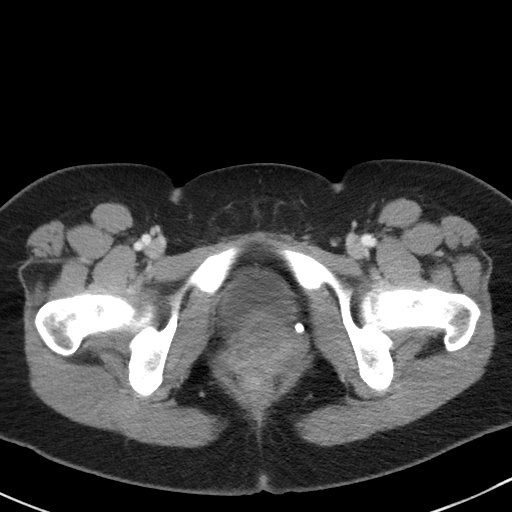
[im 22/103  soft-tissue]
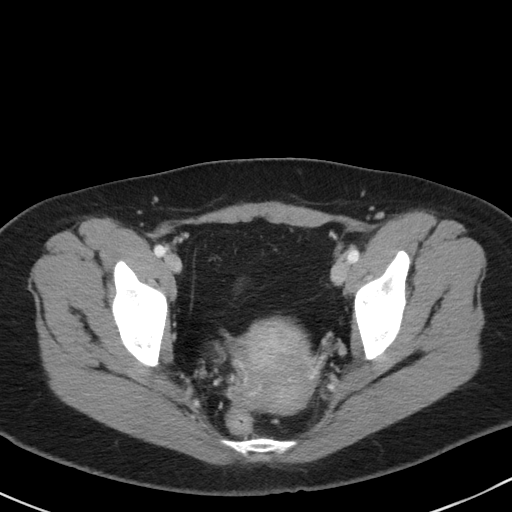
[im 30/103  soft-tissue]
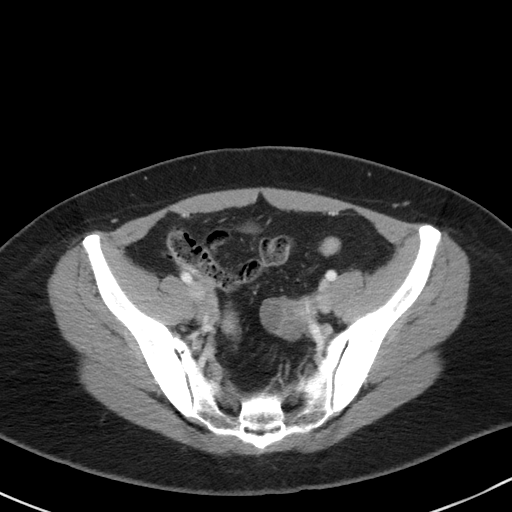
[im 39/103  soft-tissue]
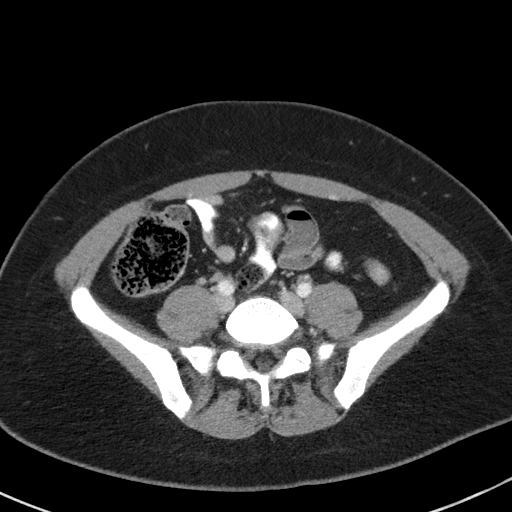
[im 47/103  soft-tissue]
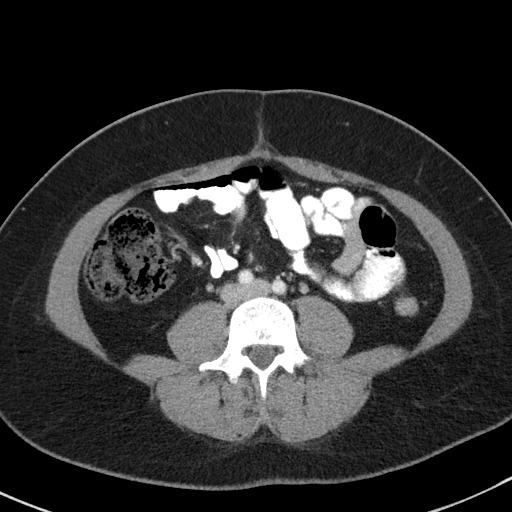
[im 56/103  soft-tissue]
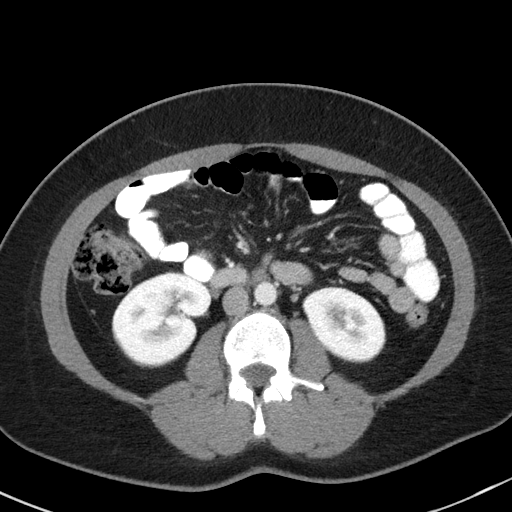
[im 64/103  soft-tissue]
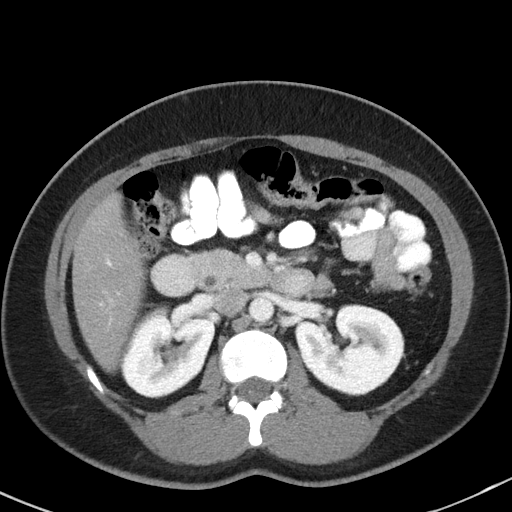
[im 73/103  soft-tissue]
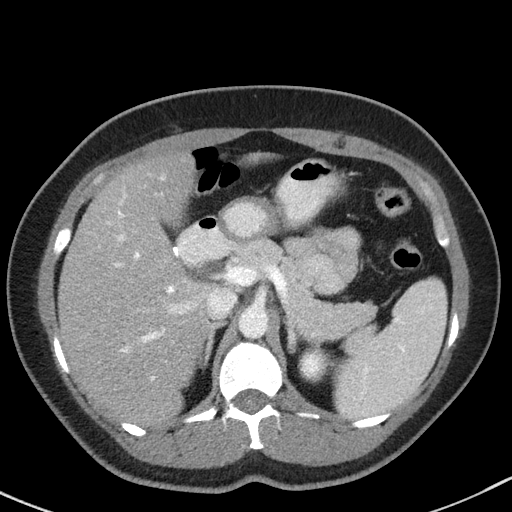
[im 73/103  bone]
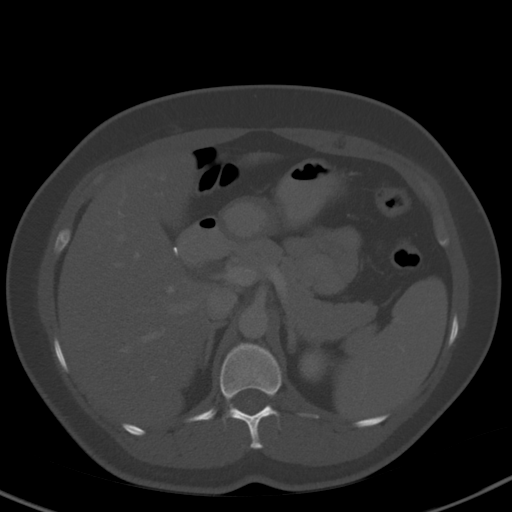
[im 81/103  soft-tissue]
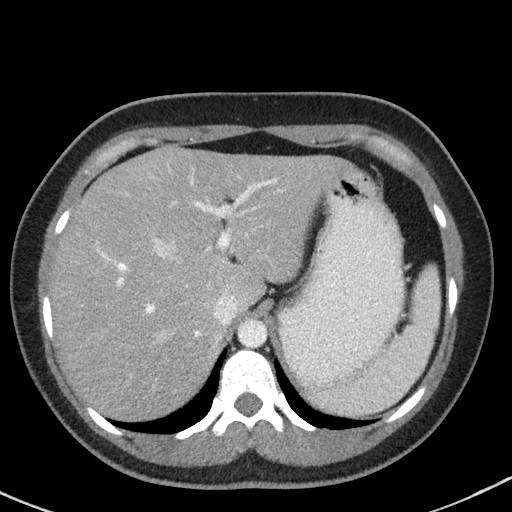
[im 90/103  soft-tissue]
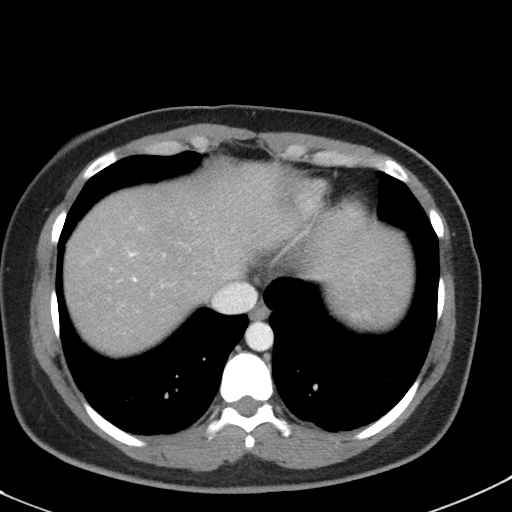
[im 98/103  soft-tissue]
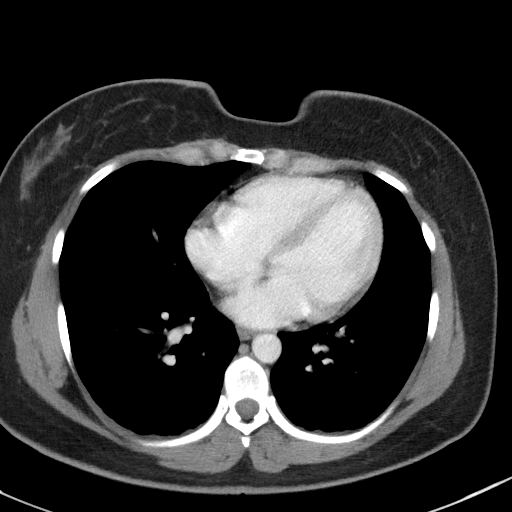

[Series 5: coronal st · coronal · 0.62mm/px · 3 of 73 slices shown]
[im 25/73  soft-tissue]
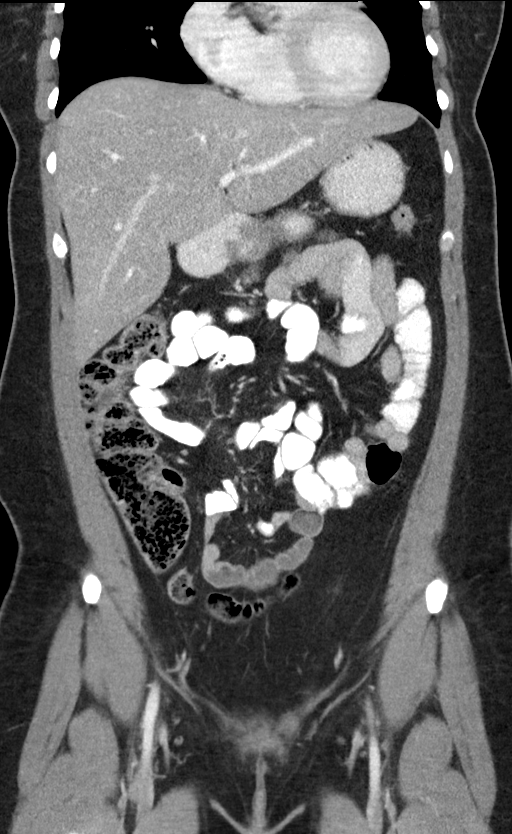
[im 33/73  soft-tissue]
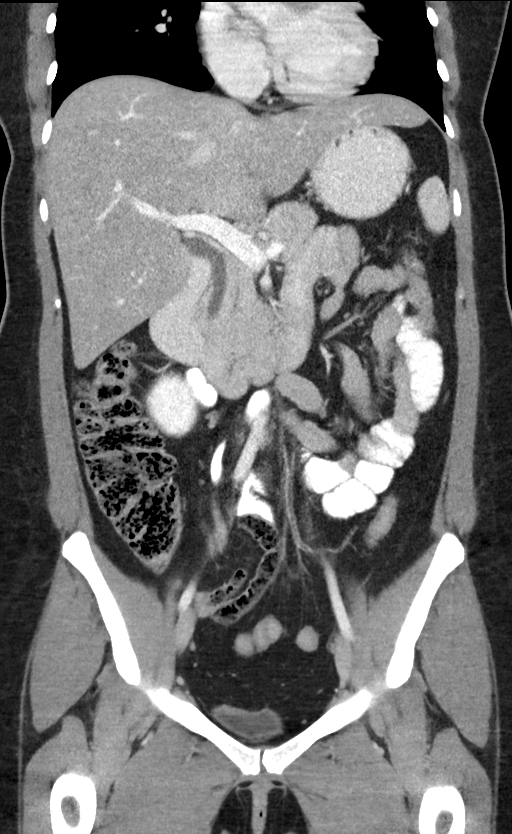
[im 41/73  soft-tissue]
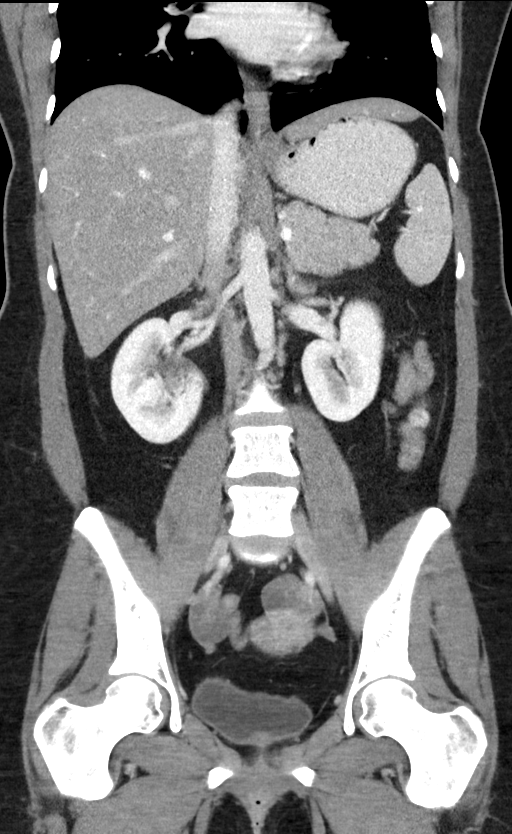

[15 of 46 positions shown; findings below may reference images not displayed]

FINDINGS: Lower chest: Normal heart size. No pericardial effusion. Bibasilar
dependent atelectasis.

Hepatobiliary: Steatosis of liver without space-occupying mass.
Cholecystectomy.

Pancreas: Unremarkable. No pancreatic ductal dilatation or
surrounding inflammatory changes.

Spleen: Normal in size without focal abnormality.

Adrenals/Urinary Tract: Adrenal glands are unremarkable. Kidneys are
normal, without renal calculi, focal lesion, or hydronephrosis.
Bladder is unremarkable.

Stomach/Bowel: Stomach is within normal limits. Appendix appears
normal. Large amount of retained stool is seen within the colon from
cecum through distal transverse. No evidence of bowel wall
thickening, distention, or inflammatory changes.

Vascular/Lymphatic: No significant vascular findings are present. No
enlarged abdominal or pelvic lymph nodes.

Reproductive: 980 noted in the endometrial cavity. No perforation.
No adnexal mass. The uterus is unremarkable.

Other: No free air nor free fluid.

Musculoskeletal: No acute osseous abnormality. Broad-based mild disc
bulge L5-S1.
IMPRESSION: Increased stool retention within the colon.

No acute bowel obstruction or inflammation. No apparent
genitourinary calculus nor obstruction.

## 2019-08-30 ENCOUNTER — Ambulatory Visit (INDEPENDENT_AMBULATORY_CARE_PROVIDER_SITE_OTHER): Payer: Managed Care, Other (non HMO) | Admitting: Podiatry

## 2019-08-30 ENCOUNTER — Other Ambulatory Visit: Payer: Self-pay

## 2019-08-30 DIAGNOSIS — L600 Ingrowing nail: Secondary | ICD-10-CM | POA: Diagnosis not present

## 2019-08-30 MED ORDER — DOXYCYCLINE HYCLATE 100 MG PO TABS: 100.0000 mg | ORAL_TABLET | Freq: Two times a day (BID) | ORAL | 0 refills | Status: AC

## 2019-08-30 MED ORDER — GENTAMICIN SULFATE 0.1 % EX CREA: 1.0000 "application " | TOPICAL_CREAM | Freq: Two times a day (BID) | CUTANEOUS | 1 refills | Status: AC

## 2019-08-30 NOTE — Patient Instructions (Signed)

## 2019-09-05 NOTE — Progress Notes (Signed)
   Subjective: Patient presents today for evaluation of sharp pain to the medial and lateral borders of the bilateral great toes that began 2-3 weeks ago. She reports associated purulent drainage and swelling. Patient is concerned for possible ingrown nail. Walking increases the pain. She has been soaking the toes in Epsom salt for treatment. Patient presents today for further treatment and evaluation.  Past Medical History:  Diagnosis Date  . Anemia   . Migraines     Objective:  General: Well developed, nourished, in no acute distress, alert and oriented x3   Dermatology: Skin is warm, dry and supple bilateral. Medial and lateral borders of the bilateral great toes appears to be erythematous with evidence of an ingrowing nail. Pain on palpation noted to the border of the nail fold. The remaining nails appear unremarkable at this time. There are no open sores, lesions.  Vascular: Dorsalis Pedis artery and Posterior Tibial artery pedal pulses palpable. No lower extremity edema noted.   Neruologic: Grossly intact via light touch bilateral.  Musculoskeletal: Muscular strength within normal limits in all groups bilateral. Normal range of motion noted to all pedal and ankle joints.   Assesement: #1 Paronychia with ingrowing nail medial and lateral borders bilateral great toes  #2 Pain in toe #3 Incurvated nail  Plan of Care:  1. Patient evaluated.  2. Discussed treatment alternatives and plan of care. Explained nail avulsion procedure and post procedure course to patient. 3. Patient opted for permanent partial nail avulsion of the medial and lateral borders of the bilateral great toes.  4. Prior to procedure, local anesthesia infiltration utilized using 3 ml of a 50:50 mixture of 2% plain lidocaine and 0.5% plain marcaine in a normal hallux block fashion and a betadine prep performed.  5. Partial permanent nail avulsion with chemical matrixectomy performed using 3x30sec applications of  phenol followed by alcohol flush.  6. Light dressing applied. 7. Prescription for Gentamicin cream provided to patient to use daily with a bandage.  8. Prescription for Doxycycline 100 mg provided to patient.  9. Return to clinic in 2 weeks.  Felecia Shelling, DPM [facility]  Dr. Felecia Shelling, DPM    [address]                                        [place], [place] [address]                Office [phone]  Fax [phone]

## 2019-09-13 ENCOUNTER — Ambulatory Visit: Payer: Managed Care, Other (non HMO) | Admitting: Podiatry

## 2019-09-13 ENCOUNTER — Other Ambulatory Visit: Payer: Self-pay

## 2019-09-13 DIAGNOSIS — L600 Ingrowing nail: Secondary | ICD-10-CM

## 2019-09-16 NOTE — Progress Notes (Signed)
   Subjective: 39 y.o. female presents today status post permanent nail avulsion procedure of the medial and lateral borders of the bilateral great toes that was performed on 08/30/2019. She states she is doing well and improving. She reports some soreness of the right hallux. She denies drainage. She has taken Doxycycline and has been using Gentamicin cream as directed. Patient is here for further evaluation and treatment.    Past Medical History:  Diagnosis Date  . Anemia   . Migraines     Objective: Skin is warm, dry and supple. Nail and respective nail fold appears to be healing appropriately. Open wound to the associated nail fold with a granular wound base and moderate amount of fibrotic tissue. Minimal drainage noted. Mild erythema around the periungual region likely due to phenol chemical matricectomy.  Assessment: #1 postop permanent partial nail avulsion medial and lateral borders of the bilateral great toes  #2 open wound periungual nail fold of respective digit.   Plan of care: #1 patient was evaluated  #2 debridement of open wound was performed to the periungual border of the respective toe using a currette. Antibiotic ointment and Band-Aid was applied. #3 patient is to return to clinic on a PRN basis.   Felecia Shelling, DPM [facility]  Dr. Felecia Shelling, DPM    [address]                                        [place], [place] [address]                Office [phone]  Fax [phone]

## 2021-04-04 ENCOUNTER — Other Ambulatory Visit: Payer: Self-pay

## 2021-04-04 ENCOUNTER — Encounter: Payer: Self-pay | Admitting: Emergency Medicine

## 2021-04-04 ENCOUNTER — Emergency Department: Payer: Self-pay

## 2021-04-04 ENCOUNTER — Emergency Department
Admission: EM | Admit: 2021-04-04 | Discharge: 2021-04-04 | Disposition: A | Discharge status: Home / Self Care | Payer: Self-pay | Attending: Emergency Medicine | Admitting: Emergency Medicine

## 2021-04-04 DIAGNOSIS — F17210 Nicotine dependence, cigarettes, uncomplicated: Secondary | ICD-10-CM | POA: Insufficient documentation

## 2021-04-04 DIAGNOSIS — Z794 Long term (current) use of insulin: Secondary | ICD-10-CM | POA: Insufficient documentation

## 2021-04-04 DIAGNOSIS — R0600 Dyspnea, unspecified: Secondary | ICD-10-CM | POA: Insufficient documentation

## 2021-04-04 DIAGNOSIS — E119 Type 2 diabetes mellitus without complications: Secondary | ICD-10-CM | POA: Insufficient documentation

## 2021-04-04 DIAGNOSIS — R0789 Other chest pain: Secondary | ICD-10-CM | POA: Insufficient documentation

## 2021-04-04 LAB — COMPREHENSIVE METABOLIC PANEL
ALT: 44 U/L (ref 0–44)
AST: 32 U/L (ref 15–41)
Albumin: 4.2 g/dL (ref 3.5–5.0)
Alkaline Phosphatase: 61 U/L (ref 38–126)
Anion gap: 8 (ref 5–15)
BUN: 11 mg/dL (ref 6–20)
CO2: 28 mmol/L (ref 22–32)
Calcium: 9.6 mg/dL (ref 8.9–10.3)
Chloride: 102 mmol/L (ref 98–111)
Creatinine, Ser: 0.73 mg/dL (ref 0.44–1.00)
GFR, Estimated: >60 mL/min (ref >60)
Glucose, Bld: 137 mg/dL — ABNORMAL HIGH (ref 70–99)
Potassium: 3.4 mmol/L — ABNORMAL LOW (ref 3.5–5.1)
Sodium: 138 mmol/L (ref 135–145)
Total Bilirubin: 0.7 mg/dL (ref 0.3–1.2)
Total Protein: 8.3 g/dL — ABNORMAL HIGH (ref 6.5–8.1)

## 2021-04-04 LAB — CBC WITH DIFFERENTIAL/PLATELET
Abs Immature Granulocytes: 0.04 10*3/uL (ref 0.00–0.07)
Basophils Absolute: 0.1 10*3/uL (ref 0.0–0.1)
Basophils Relative: 1 %
Eosinophils Absolute: 0.2 10*3/uL (ref 0.0–0.5)
Eosinophils Relative: 1 %
HCT: 41.2 % (ref 36.0–46.0)
Hemoglobin: 14.6 g/dL (ref 12.0–15.0)
Immature Granulocytes: 0 %
Lymphocytes Relative: 30 %
Lymphs Abs: 3.1 10*3/uL (ref 0.7–4.0)
MCH: 32.4 pg (ref 26.0–34.0)
MCHC: 35.4 g/dL (ref 30.0–36.0)
MCV: 91.4 fL (ref 80.0–100.0)
Monocytes Absolute: 0.8 10*3/uL (ref 0.1–1.0)
Monocytes Relative: 8 %
Neutro Abs: 6.2 10*3/uL (ref 1.7–7.7)
Neutrophils Relative %: 60 %
Platelets: 335 10*3/uL (ref 150–400)
RBC: 4.51 MIL/uL (ref 3.87–5.11)
RDW: 11.7 % (ref 11.5–15.5)
WBC: 10.4 10*3/uL (ref 4.0–10.5)
nRBC: 0 % (ref 0.0–0.2)

## 2021-04-04 LAB — TROPONIN I (HIGH SENSITIVITY): Troponin I (High Sensitivity): <2 ng/L (ref <18)

## 2021-04-04 MED ORDER — IBUPROFEN 800 MG PO TABS: 800.0000 mg | ORAL_TABLET | Freq: Three times a day (TID) | ORAL | 0 refills | Status: AC | PRN

## 2021-04-04 MED ORDER — KETOROLAC TROMETHAMINE 30 MG/ML IJ SOLN
30.0000 mg | Freq: Once | INTRAMUSCULAR | Status: AC
  Administered 2021-04-04: 30 mg via INTRAVENOUS
  Filled 2021-04-04: qty 1

## 2021-04-04 MED ORDER — IBUPROFEN 800 MG PO TABS: 800.0000 mg | ORAL_TABLET | Freq: Once | ORAL | Status: DC

## 2021-04-04 NOTE — Discharge Instructions (Signed)
You may alternate Tylenol 1000 mg every 6 hours as needed for pain, fever and Ibuprofen 800 mg every 8 hours as needed for pain, fever.  Please take Ibuprofen with food.  Do not take more than 4000 mg of Tylenol (acetaminophen) in a 24 hour period.  

## 2021-04-04 NOTE — ED Triage Notes (Signed)
Pt reports mid CP, nonradiating accomp by [facility] that began while at work tonight; denies hx of same

## 2021-04-04 NOTE — ED Provider Notes (Signed)
[facility] Emergency Department Provider Note  ____________________________________________   Event Date/Time   First MD Initiated Contact with Patient 04/04/21 0129     (approximate)  I have reviewed the triage vital signs and the nursing notes.   HISTORY  Chief Complaint Chest Pain    HPI Laura Mcguire is a 40 y.o. female with history of insulin-dependent diabetes, tobacco use who presents to the emergency department complaints of central chest pain that started yesterday.  States that pain feels like she has been "hit" in the center of her chest and radiates across the anterior chest.  States it hurts to touch and take a deep breath.  No fevers, cough.  Has had some nausea but no vomiting.  No diaphoresis or dizziness.  No history of hypertension, hyperlipidemia.  No family history of premature CAD.  No history of PE, DVT, exogenous estrogen use, recent fractures, surgery, trauma, hospitalization, prolonged travel or other immobilization. No lower extremity swelling or pain. No calf tenderness.  Denies any injury to the chest wall or increased physical exertion.  Did not take any medications prior to arrival.     Past Medical History:  Diagnosis Date   Anemia    Migraines     Patient Active Problem List   Diagnosis Date Noted   Pregnancy 11/08/2016   Vaginal bleeding in pregnancy, third trimester 10/13/2016   Vaginal bleeding in pregnancy, second trimester 07/02/2016   Family history of autism    Right hemiparesis (HCC) 10/10/2015   Headache 10/09/2015    Past Surgical History:  Procedure Laterality Date   CHOLECYSTECTOMY     GALLBLADDER SURGERY      Prior to Admission medications   Medication Sig Start Date End Date Taking? Authorizing Provider  ibuprofen (ADVIL) 800 MG tablet Take 1 tablet (800 mg total) by mouth every 8 (eight) hours as needed for mild pain. 04/04/21  Yes Amun Stemm, Layla Maw, DO  clindamycin (CLEOCIN) 300 MG  capsule Take 300 mg by mouth 3 (three) times daily. 08/23/19   [provider]  doxycycline (VIBRA-TABS) 100 MG tablet Take 1 tablet (100 mg total) by mouth 2 (two) times daily. 08/30/19   Felecia Shelling, DPM  gentamicin cream (GARAMYCIN) 0.1 % Apply 1 application topically 2 (two) times daily. 08/30/19   Felecia Shelling, DPM  predniSONE (DELTASONE) 50 MG tablet Take one 50 mg tablet once daily for the next five days. 05/06/18   Orvil Feil, PA-C  tobramycin (TOBREX) 0.3 % ophthalmic solution 1 drop every 4 (four) hours. 08/23/19   [provider]    Allergies Ampicillin, Penicillins, and Penicillins  Family History  Problem Relation Age of Onset   Diabetes Unknown    Stroke Unknown     Social History Social History   Tobacco Use   Smoking status: Never   Smokeless tobacco: Never  Vaping Use   Vaping Use: Never used  Substance Use Topics   Alcohol use: No    Alcohol/week: 0.0 standard drinks    Comment: Socially    Drug use: No    Review of Systems Constitutional: No fever. Eyes: No visual changes. ENT: No sore throat. Cardiovascular: + chest pain. Respiratory: Denies shortness of breath. Gastrointestinal: No nausea, vomiting, diarrhea. Genitourinary: Negative for dysuria. Musculoskeletal: Negative for back pain. Skin: Negative for rash. Neurological: Negative for focal weakness or numbness.  ____________________________________________   PHYSICAL EXAM:  VITAL SIGNS: ED Triage Vitals  Enc Vitals Group  BP 04/04/21 0045 128/83     Pulse Rate 04/04/21 0045 91     Resp 04/04/21 0045 16     Temp 04/04/21 0045 98.7 F (37.1 C)     Temp Source 04/04/21 0045 Oral     SpO2 04/04/21 0045 99 %     Weight 04/04/21 0041 156 lb (70.8 kg)     Height 04/04/21 0041 5\' 2"  (1.575 m)     Head Circumference --      Peak Flow --      Pain Score 04/04/21 0041 10     Pain Loc --      Pain Edu? --      Excl. in GC? --    CONSTITUTIONAL: Alert and  oriented and responds appropriately to questions. Well-appearing; well-nourished HEAD: Normocephalic EYES: Conjunctivae clear, pupils appear equal, EOM appear intact ENT: normal nose; moist mucous membranes NECK: Supple, normal ROM  CARD: RRR; S1 and S2 appreciated; no murmurs, no clicks, no rubs, no gallops CHEST:  Chest wall is extremely tender to palpation.  No crepitus, ecchymosis, erythema, warmth, rash or other lesions present.   RESP: Normal chest excursion without splinting or tachypnea; breath sounds clear and equal bilaterally; no wheezes, no rhonchi, no rales, no hypoxia or respiratory distress, speaking full sentences ABD/GI: Normal bowel sounds; non-distended; soft, non-tender, no rebound, no guarding, no peritoneal signs, no hepatosplenomegaly BACK: The back appears normal EXT: Normal ROM in all joints; no deformity noted, no edema; no cyanosis, no calf tenderness or calf swelling SKIN: Normal color for age and race; warm; no rash on exposed skin NEURO: Moves all extremities equally PSYCH: The patient's mood and manner are appropriate.  ____________________________________________   LABS (all labs ordered are listed, but only abnormal results are displayed)  Labs Reviewed  COMPREHENSIVE METABOLIC PANEL - Abnormal; Notable for the following components:      Result Value   Potassium 3.4 (*)    Glucose, Bld 137 (*)    Total Protein 8.3 (*)    All other components within normal limits  CBC WITH DIFFERENTIAL/PLATELET  TROPONIN I (HIGH SENSITIVITY)   ____________________________________________  EKG   EKG Interpretation  Date/Time:  Thursday April 04 2021 00:42:58 EDT Ventricular Rate:  89 PR Interval:  126 QRS Duration: 74 QT Interval:  366 QTC Calculation: 445 R Axis:   7 Text Interpretation: Normal sinus rhythm with sinus arrhythmia Cannot rule out Anterior infarct , age undetermined Abnormal ECG Confirmed by 09[birth year]14[birth year]1985 [phone]) on 04/04/2021 1:34:17 AM         ____________________________________________  RADIOLOGY 04/06/2021 Labrina Lines, personally viewed and evaluated these images (plain radiographs) as part of my medical decision making, as well as reviewing the written report by the radiologist.  ED MD interpretation: Chest x-ray clear.  Official radiology report(s): DG Chest 2 View  Result Date: 04/04/2021 CLINICAL DATA:  Chest pain, dyspnea EXAM: CHEST - 2 VIEW COMPARISON:  None. FINDINGS: The heart size and mediastinal contours are within normal limits. Both lungs are clear. The visualized skeletal structures are unremarkable. IMPRESSION: No active cardiopulmonary disease. Electronically Signed   By: 04/06/2021 M.D.   On: 04/04/2021 01:35    ____________________________________________   PROCEDURES  Procedure(s) performed (including Critical Care):  Procedures   ____________________________________________   INITIAL IMPRESSION / ASSESSMENT AND PLAN / ED COURSE  As part of my medical decision making, I reviewed the following data within the electronic MEDICAL RECORD NUMBER History obtained from family, Interpreter needed, Labs reviewed , EKG  interpreted , Old EKG reviewed, Old chart reviewed, Radiograph reviewed , and Notes from prior ED visits         Patient here with very atypical chest pain.  She is extremely tender to palpation over the chest wall diffusely without rash, lesions, ecchymosis or soft tissue swelling.  I suspect that this is musculoskeletal in nature.  EKG shows no new ischemic change.  Troponin negative.  Chest x-ray clear.  No sign of pneumonia, pneumothorax, rib fracture.  No signs of fluid overload.  She is PERC negative.  Doubt dissection.  Low suspicion for ACS.  Will give Toradol here and discharged with instructions to alternate Tylenol and Motrin.  Given pain ongoing for 2 days, I do not feel she needs serial troponins.  I feel she is safe for discharge home.  Recommended close follow-up with her  primary care doctor if pain is not improving with time.  At this time, I do not feel there is any life-threatening condition present. I have reviewed, interpreted and discussed all results (EKG, imaging, lab, urine as appropriate) and exam findings with patient/family. I have reviewed nursing notes and appropriate previous records.  I feel the patient is safe to be discharged home without further emergent workup and can continue workup as an outpatient as needed. Discussed usual and customary return precautions. Patient/family verbalize understanding and are comfortable with this plan.  Outpatient follow-up has been provided as needed. All questions have been answered.  ____________________________________________   FINAL CLINICAL IMPRESSION(S) / ED DIAGNOSES  Final diagnoses:  Chest wall pain     ED Discharge Orders          Ordered    ibuprofen (ADVIL) 800 MG tablet  Every 8 hours PRN        04/04/21 0225            *Please note:  Deveney L Stachia Slutsky was evaluated in Emergency Department on 04/04/2021 for the symptoms described in the history of present illness. She was evaluated in the context of the global COVID-19 pandemic, which necessitated consideration that the patient might be at risk for infection with the SARS-CoV-2 virus that causes COVID-19. Institutional protocols and algorithms that pertain to the evaluation of patients at risk for COVID-19 are in a state of rapid change based on information released by regulatory bodies including the CDC and federal and state organizations. These policies and algorithms were followed during the patient's care in the ED.  Some ED evaluations and interventions may be delayed as a result of limited staffing during and the pandemic.*   Note:  This document was prepared using Dragon voice recognition software and may include unintentional dictation errors.    Japleen Tornow, Layla Maw, DO 04/04/21 [phone]

## 2022-04-09 IMAGING — CR DG CHEST 2V
1 series · 2 of 2 positions shown · non-contrast
Comparison: None.

CLINICAL DATA: Chest pain, dyspnea

EXAM:
CHEST - 2 VIEW

[Series 1: dg chest 2 view · 0.14mm/px · 2 of 2 slices shown]
[im 1/2]
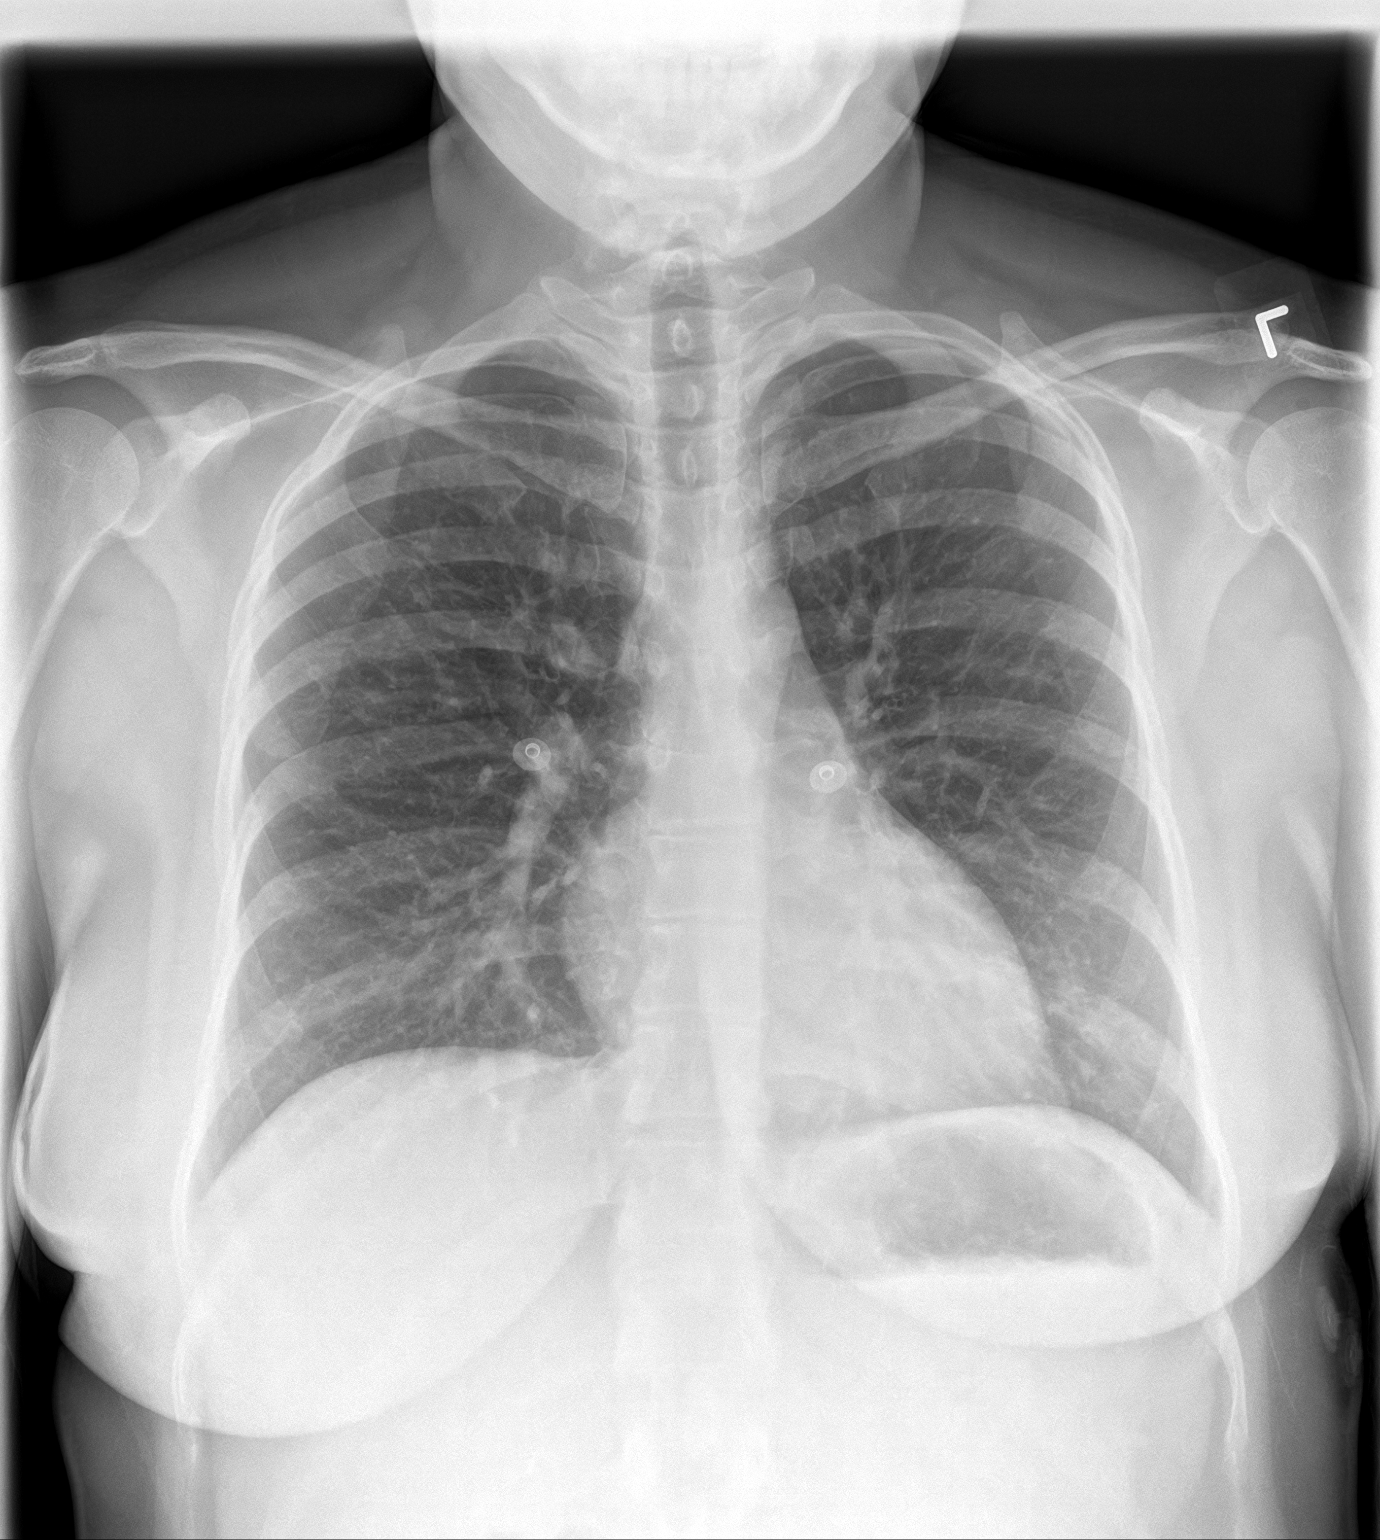
[im 2/2]
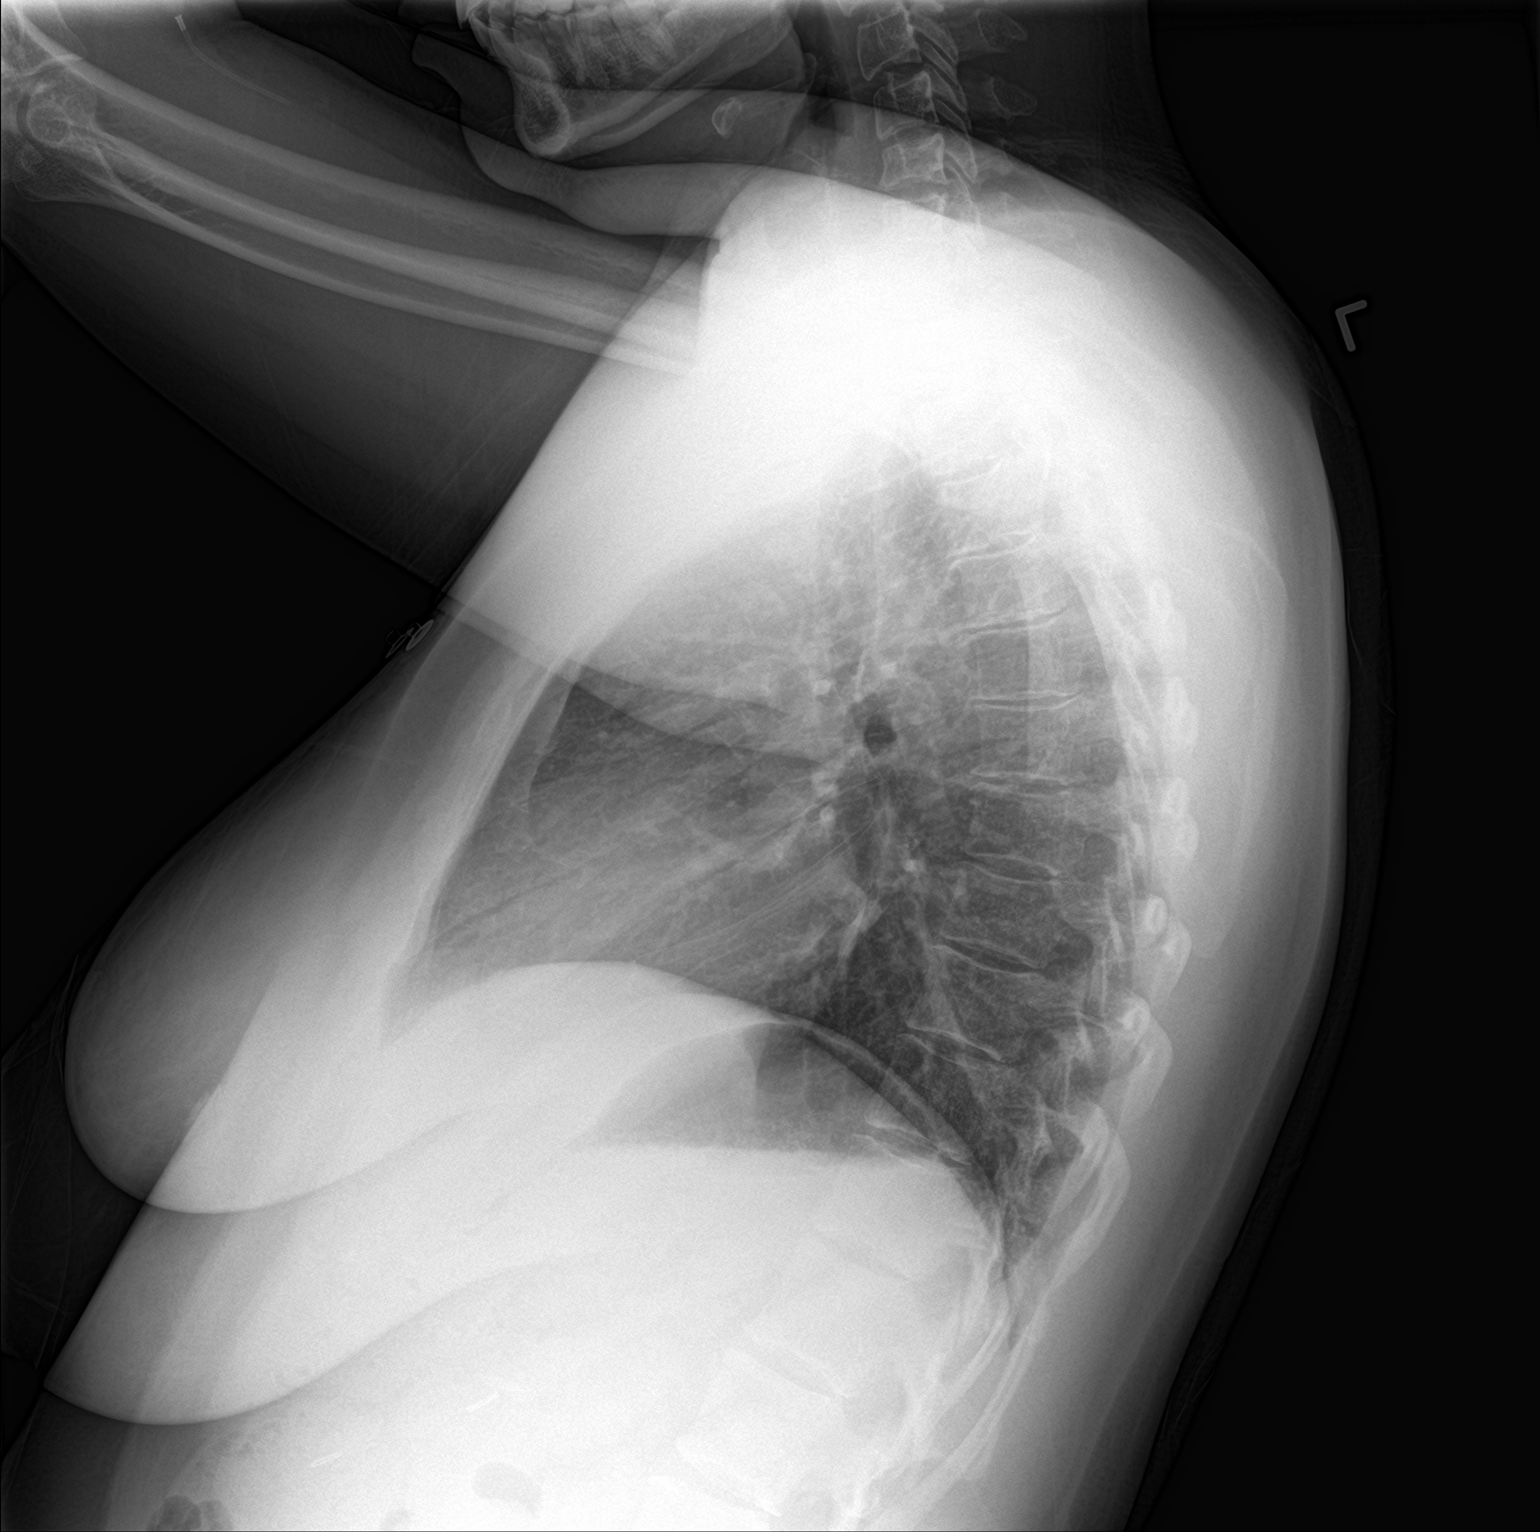

[2 of 2 positions shown; findings below may reference images not displayed]

FINDINGS: The heart size and mediastinal contours are within normal limits.
Both lungs are clear. The visualized skeletal structures are
unremarkable.
IMPRESSION: No active cardiopulmonary disease.

## 2023-03-27 ENCOUNTER — Telehealth: Payer: Self-pay | Admitting: *Deleted

## 2023-04-20 ENCOUNTER — Other Ambulatory Visit: Payer: Self-pay | Admitting: Physician Assistant

## 2023-04-20 DIAGNOSIS — Z1231 Encounter for screening mammogram for malignant neoplasm of breast: Secondary | ICD-10-CM

## 2023-04-30 ENCOUNTER — Ambulatory Visit
Admission: RE | Admit: 2023-04-30 | Discharge: 2023-04-30 | Disposition: A | Discharge status: Home / Self Care | Payer: Self-pay | Source: Ambulatory Visit | Attending: Physician Assistant | Admitting: Physician Assistant

## 2023-04-30 DIAGNOSIS — Z1231 Encounter for screening mammogram for malignant neoplasm of breast: Secondary | ICD-10-CM | POA: Insufficient documentation

## 2023-05-04 ENCOUNTER — Other Ambulatory Visit: Payer: Self-pay | Admitting: Physician Assistant

## 2023-05-04 DIAGNOSIS — R928 Other abnormal and inconclusive findings on diagnostic imaging of breast: Secondary | ICD-10-CM

## 2023-05-04 DIAGNOSIS — N6489 Other specified disorders of breast: Secondary | ICD-10-CM

## 2023-05-15 ENCOUNTER — Ambulatory Visit
Admission: RE | Admit: 2023-05-15 | Discharge: 2023-05-15 | Disposition: A | Discharge status: Home / Self Care | Payer: Self-pay | Source: Ambulatory Visit | Attending: Physician Assistant | Admitting: Physician Assistant

## 2023-05-15 DIAGNOSIS — R928 Other abnormal and inconclusive findings on diagnostic imaging of breast: Secondary | ICD-10-CM

## 2023-05-15 DIAGNOSIS — N6489 Other specified disorders of breast: Secondary | ICD-10-CM
